# Patient Record
Sex: Female | Born: 1995 | Race: Black or African American | Hispanic: No | Marital: Single | Smoking: Current every day smoker
Health system: Southern US, Community
[De-identification: ages and names within clinical notes are randomized; demographics above are authoritative.]

## PROBLEM LIST (undated history)

## (undated) DIAGNOSIS — F32A Depression, unspecified: Secondary | ICD-10-CM

## (undated) DIAGNOSIS — F329 Major depressive disorder, single episode, unspecified: Secondary | ICD-10-CM

## (undated) DIAGNOSIS — F39 Unspecified mood [affective] disorder: Secondary | ICD-10-CM

## (undated) DIAGNOSIS — Z9109 Other allergy status, other than to drugs and biological substances: Secondary | ICD-10-CM

## (undated) DIAGNOSIS — F419 Anxiety disorder, unspecified: Secondary | ICD-10-CM

## (undated) DIAGNOSIS — K219 Gastro-esophageal reflux disease without esophagitis: Secondary | ICD-10-CM

---
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
## 2014-04-14 ENCOUNTER — Emergency Department (HOSPITAL_COMMUNITY)
Admission: EM | Admit: 2014-04-14 | Discharge: 2014-04-14 | Disposition: A | Discharge status: Home / Self Care | Payer: Self-pay | Attending: Emergency Medicine | Admitting: Emergency Medicine

## 2014-04-14 ENCOUNTER — Emergency Department (HOSPITAL_COMMUNITY): Payer: No Typology Code available for payment source

## 2014-04-14 ENCOUNTER — Encounter (HOSPITAL_COMMUNITY): Payer: Self-pay | Admitting: *Deleted

## 2014-04-14 DIAGNOSIS — Z3202 Encounter for pregnancy test, result negative: Secondary | ICD-10-CM | POA: Insufficient documentation

## 2014-04-14 DIAGNOSIS — Z79899 Other long term (current) drug therapy: Secondary | ICD-10-CM | POA: Insufficient documentation

## 2014-04-14 DIAGNOSIS — Y998 Other external cause status: Secondary | ICD-10-CM | POA: Insufficient documentation

## 2014-04-14 DIAGNOSIS — S60222A Contusion of left hand, initial encounter: Secondary | ICD-10-CM | POA: Insufficient documentation

## 2014-04-14 DIAGNOSIS — J45909 Unspecified asthma, uncomplicated: Secondary | ICD-10-CM | POA: Insufficient documentation

## 2014-04-14 DIAGNOSIS — W1839XA Other fall on same level, initial encounter: Secondary | ICD-10-CM | POA: Insufficient documentation

## 2014-04-14 DIAGNOSIS — Z720 Tobacco use: Secondary | ICD-10-CM | POA: Insufficient documentation

## 2014-04-14 DIAGNOSIS — S8992XA Unspecified injury of left lower leg, initial encounter: Secondary | ICD-10-CM | POA: Insufficient documentation

## 2014-04-14 DIAGNOSIS — Y9389 Activity, other specified: Secondary | ICD-10-CM | POA: Insufficient documentation

## 2014-04-14 DIAGNOSIS — Z8719 Personal history of other diseases of the digestive system: Secondary | ICD-10-CM | POA: Insufficient documentation

## 2014-04-14 DIAGNOSIS — Y929 Unspecified place or not applicable: Secondary | ICD-10-CM | POA: Insufficient documentation

## 2014-04-14 DIAGNOSIS — M25559 Pain in unspecified hip: Secondary | ICD-10-CM

## 2014-04-14 DIAGNOSIS — S7002XA Contusion of left hip, initial encounter: Secondary | ICD-10-CM | POA: Insufficient documentation

## 2014-04-14 LAB — POC URINE PREG, ED: Preg Test, Ur: NEGATIVE

## 2014-04-14 MED ORDER — LORAZEPAM 1 MG PO TABS
0.5000 mg | ORAL_TABLET | Freq: Once | ORAL | Status: AC
  Administered 2014-04-14: 0.5 mg via ORAL
  Filled 2014-04-14: qty 1

## 2014-04-14 MED ORDER — HYDROCODONE-ACETAMINOPHEN 5-325 MG PO TABS: 1.0000 | ORAL_TABLET | Freq: Four times a day (QID) | ORAL | Status: DC | PRN

## 2014-04-14 MED ORDER — HYDROCODONE-ACETAMINOPHEN 5-325 MG PO TABS
1.0000 | ORAL_TABLET | Freq: Once | ORAL | Status: AC
  Administered 2014-04-14: 1 via ORAL
  Filled 2014-04-14: qty 1
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
## 2014-04-14 NOTE — ED Notes (Signed)
MD at bedside. 

## 2014-04-14 NOTE — Discharge Instructions (Signed)
Contusion °A contusion is a deep bruise. Contusions are the result of an injury that caused bleeding under the skin. The contusion may turn blue, purple, or yellow. Minor injuries will give you a painless contusion, but more severe contusions may stay painful and swollen for a few weeks.  °CAUSES  °A contusion is usually caused by a blow, trauma, or direct force to an area of the body. °SYMPTOMS  °· Swelling and redness of the injured area. °· Bruising of the injured area. °· Tenderness and soreness of the injured area. °· Pain. °DIAGNOSIS  °The diagnosis can be made by taking a history and physical exam. An X-ray, CT scan, or MRI may be needed to determine if there were any associated injuries, such as fractures. °TREATMENT  °Specific treatment will depend on what area of the body was injured. In general, the best treatment for a contusion is resting, icing, elevating, and applying cold compresses to the injured area. Over-the-counter medicines may also be recommended for pain control. Ask your caregiver what the best treatment is for your contusion. °HOME CARE INSTRUCTIONS  °· Put ice on the injured area. °¨ Put ice in a plastic bag. °¨ Place a towel between your skin and the bag. °¨ Leave the ice on for 15-20 minutes, 3-4 times a day, or as directed by your health care provider. °· Only take over-the-counter or prescription medicines for pain, discomfort, or fever as directed by your caregiver. Your caregiver may recommend avoiding anti-inflammatory medicines (aspirin, ibuprofen, and naproxen) for 48 hours because these medicines may increase bruising. °· Rest the injured area. °· If possible, elevate the injured area to reduce swelling. °SEEK IMMEDIATE MEDICAL CARE IF:  °· You have increased bruising or swelling. °· You have pain that is getting worse. °· Your swelling or pain is not relieved with medicines. °MAKE SURE YOU:  °· Understand these instructions. °· Will watch your condition. °· Will get help right  away if you are not doing well or get worse. °Document Released: 10/13/2004 Document Revised: 01/08/2013 Document Reviewed: 11/08/2010 °[employer]® Patient Information ©2015 [employer]. This information is not intended to replace advice given to you by your health care provider. Make sure you discuss any questions you have with your health care provider. ° °

## 2014-04-14 NOTE — ED Notes (Signed)
Patient transported to X-ray 
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
## 2014-08-30 ENCOUNTER — Encounter (HOSPITAL_BASED_OUTPATIENT_CLINIC_OR_DEPARTMENT_OTHER): Payer: Self-pay | Admitting: *Deleted

## 2014-08-30 ENCOUNTER — Telehealth: Payer: Self-pay | Admitting: *Deleted

## 2014-08-30 ENCOUNTER — Emergency Department (HOSPITAL_BASED_OUTPATIENT_CLINIC_OR_DEPARTMENT_OTHER)
Admission: EM | Admit: 2014-08-30 | Discharge: 2014-08-30 | Disposition: A | Discharge status: Home / Self Care | Payer: Self-pay | Attending: Physician Assistant | Admitting: Physician Assistant

## 2014-08-30 ENCOUNTER — Emergency Department (HOSPITAL_BASED_OUTPATIENT_CLINIC_OR_DEPARTMENT_OTHER): Payer: Self-pay

## 2014-08-30 DIAGNOSIS — Z3202 Encounter for pregnancy test, result negative: Secondary | ICD-10-CM | POA: Insufficient documentation

## 2014-08-30 DIAGNOSIS — Z87891 Personal history of nicotine dependence: Secondary | ICD-10-CM | POA: Insufficient documentation

## 2014-08-30 DIAGNOSIS — J45909 Unspecified asthma, uncomplicated: Secondary | ICD-10-CM | POA: Insufficient documentation

## 2014-08-30 DIAGNOSIS — Z8739 Personal history of other diseases of the musculoskeletal system and connective tissue: Secondary | ICD-10-CM | POA: Insufficient documentation

## 2014-08-30 DIAGNOSIS — Z79899 Other long term (current) drug therapy: Secondary | ICD-10-CM | POA: Insufficient documentation

## 2014-08-30 DIAGNOSIS — J189 Pneumonia, unspecified organism: Secondary | ICD-10-CM

## 2014-08-30 DIAGNOSIS — F329 Major depressive disorder, single episode, unspecified: Secondary | ICD-10-CM | POA: Insufficient documentation

## 2014-08-30 DIAGNOSIS — J159 Unspecified bacterial pneumonia: Secondary | ICD-10-CM | POA: Insufficient documentation

## 2014-08-30 HISTORY — DX: Major depressive disorder, single episode, unspecified: F32.9

## 2014-08-30 HISTORY — DX: Unspecified mood (affective) disorder: F39

## 2014-08-30 HISTORY — DX: Depression, unspecified: F32.A

## 2014-08-30 HISTORY — DX: Anxiety disorder, unspecified: F41.9

## 2014-08-30 LAB — URINE MICROSCOPIC-ADD ON

## 2014-08-30 LAB — PREGNANCY, URINE: PREG TEST UR: NEGATIVE

## 2014-08-30 LAB — URINALYSIS, ROUTINE W REFLEX MICROSCOPIC
Bilirubin Urine: NEGATIVE
Glucose, UA: NEGATIVE mg/dL
Hgb urine dipstick: NEGATIVE
Ketones, ur: NEGATIVE mg/dL
Nitrite: NEGATIVE
PH: 7 (ref 5.0–8.0)
Protein, ur: NEGATIVE mg/dL
SPECIFIC GRAVITY, URINE: 1.017 (ref 1.005–1.030)
UROBILINOGEN UA: 1 mg/dL (ref 0.0–1.0)

## 2014-08-30 LAB — CBC WITH DIFFERENTIAL/PLATELET
BASOS ABS: 0 10*3/uL (ref 0.0–0.1)
BASOS PCT: 0 % (ref 0–1)
Band Neutrophils: 2 % (ref 0–10)
EOS ABS: 0.2 10*3/uL (ref 0.0–0.7)
Eosinophils Relative: 1 % (ref 0–5)
HEMATOCRIT: 35.2 % — AB (ref 36.0–46.0)
Hemoglobin: 12.7 g/dL (ref 12.0–15.0)
Lymphocytes Relative: 14 % (ref 12–46)
Lymphs Abs: 3.2 10*3/uL (ref 0.7–4.0)
MCH: 28.5 pg (ref 26.0–34.0)
MCHC: 36.1 g/dL — ABNORMAL HIGH (ref 30.0–36.0)
MCV: 79.1 fL (ref 78.0–100.0)
MONOS PCT: 6 % (ref 3–12)
Monocytes Absolute: 1.4 10*3/uL — ABNORMAL HIGH (ref 0.1–1.0)
NEUTROS PCT: 77 % (ref 43–77)
Neutro Abs: 18.2 10*3/uL — ABNORMAL HIGH (ref 1.7–7.7)
Platelets: 309 10*3/uL (ref 150–400)
RBC: 4.45 MIL/uL (ref 3.87–5.11)
RDW: 14.6 % (ref 11.5–15.5)
WBC: 23 10*3/uL — ABNORMAL HIGH (ref 4.0–10.5)

## 2014-08-30 LAB — BASIC METABOLIC PANEL
ANION GAP: 9 (ref 5–15)
BUN: 6 mg/dL (ref 6–20)
CHLORIDE: 106 mmol/L (ref 101–111)
CO2: 23 mmol/L (ref 22–32)
Calcium: 8.8 mg/dL — ABNORMAL LOW (ref 8.9–10.3)
Creatinine, Ser: 0.81 mg/dL (ref 0.44–1.00)
GFR calc Af Amer: >60 mL/min (ref >60)
GFR calc non Af Amer: >60 mL/min (ref >60)
Glucose, Bld: 102 mg/dL — ABNORMAL HIGH (ref 65–99)
POTASSIUM: 3.4 mmol/L — AB (ref 3.5–5.1)
Sodium: 138 mmol/L (ref 135–145)

## 2014-08-30 MED ORDER — IPRATROPIUM-ALBUTEROL 0.5-2.5 (3) MG/3ML IN SOLN
RESPIRATORY_TRACT | Status: AC
  Administered 2014-08-30: 3 mL
  Filled 2014-08-30: qty 3

## 2014-08-30 MED ORDER — ACETAMINOPHEN 325 MG PO TABS
650.0000 mg | ORAL_TABLET | Freq: Once | ORAL | Status: AC
  Administered 2014-08-30: 650 mg via ORAL
  Filled 2014-08-30: qty 2

## 2014-08-30 MED ORDER — AZITHROMYCIN 250 MG PO TABS: 250.0000 mg | ORAL_TABLET | Freq: Once | ORAL | Status: DC

## 2014-08-30 MED ORDER — ONDANSETRON HCL 4 MG PO TABS: 4.0000 mg | ORAL_TABLET | Freq: Three times a day (TID) | ORAL | Status: DC | PRN

## 2014-08-30 MED ORDER — ACETAMINOPHEN 325 MG PO TABS: 650.0000 mg | ORAL_TABLET | Freq: Four times a day (QID) | ORAL | Status: DC | PRN

## 2014-08-30 MED ORDER — ONDANSETRON 4 MG PO TBDP
4.0000 mg | ORAL_TABLET | Freq: Once | ORAL | Status: AC
  Administered 2014-08-30: 4 mg via ORAL
  Filled 2014-08-30: qty 1

## 2014-08-30 MED ORDER — AZITHROMYCIN 250 MG PO TABS
500.0000 mg | ORAL_TABLET | Freq: Once | ORAL | Status: AC
  Administered 2014-08-30: 500 mg via ORAL
  Filled 2014-08-30: qty 2

## 2014-08-30 MED ORDER — LEVOFLOXACIN 750 MG PO TABS: 750.0000 mg | ORAL_TABLET | Freq: Once | ORAL | Status: DC

## 2014-08-30 MED ORDER — SODIUM CHLORIDE 0.9 % IV BOLUS (SEPSIS)
1000.0000 mL | Freq: Once | INTRAVENOUS | Status: AC
  Administered 2014-08-30: 1000 mL via INTRAVENOUS

## 2014-08-30 MED ORDER — ALBUTEROL SULFATE (2.5 MG/3ML) 0.083% IN NEBU
INHALATION_SOLUTION | RESPIRATORY_TRACT | Status: AC
  Administered 2014-08-30: 2.5 mg
  Filled 2014-08-30: qty 3

## 2014-08-30 NOTE — ED Notes (Addendum)
Pt reports productive cough x 1 week-  States hx of allergies- has been taking mucinex, robitussin, mucous relief, benadryl, theraflu without relief

## 2014-08-30 NOTE — ED Provider Notes (Addendum)
CSN: [id number]     Arrival date & time 08/30/14  1044 History   First MD Initiated Contact with Patient 08/30/14 1129     Chief Complaint  Patient presents with  . Cough     (Consider location/radiation/quality/duration/timing/severity/associated sxs/prior Treatment) HPI   Patient is a 19 year old female presenting today with pain during deep breaths. She feels that she's had a cough. She's had occasional fevers at home. She feels unwell. Patient's been taking NyQuil and Benadryl at home. Patient is tearful on exam about how much pain she is in. She states she has pain all over. From her head down to her pelvis.   Past Medical History  Diagnosis Date  . GERD (gastroesophageal reflux disease)   . Environmental allergies   . Asthma   . Mood disorder   . Depression   . Anxiety    History reviewed. No pertinent past surgical history. Family History  Problem Relation Age of Onset  . Hypertension Mother   . Diabetes Other   . Hypertension Other    Social History  Substance Use Topics  . Smoking status: Former Smoker -- 0.00 packs/day    Types: Cigarettes    Quit date: 03/30/2014  . Smokeless tobacco: Never Used  . Alcohol Use: No   OB History    No data available     Review of Systems  Constitutional: Negative for activity change and fatigue.  HENT: Positive for congestion and sore throat. Negative for drooling.   Eyes: Negative for discharge.  Respiratory: Positive for cough and chest tightness.   Cardiovascular: Positive for chest pain.  Gastrointestinal: Negative for abdominal distention.  Genitourinary: Negative for dysuria and difficulty urinating.  Musculoskeletal: Negative for joint swelling.  Skin: Negative for rash.  Allergic/Immunologic: Negative for immunocompromised state.  Neurological: Negative for seizures and speech difficulty.  Psychiatric/Behavioral: Negative for behavioral problems and agitation.      Allergies  Review of patient's allergies  indicates no known allergies.  Home Medications   Prior to Admission medications   Medication Sig Start Date End Date Taking? Authorizing Provider  EPINEPHrine (EPIPEN 2-PAK IJ) Inject 1 Syringe as directed once as needed. For allergic reaction   Yes Historical Provider, MD  QUEtiapine (SEROQUEL) 100 MG tablet Take 100 mg by mouth at bedtime.   Yes Historical Provider, MD  albuterol (PROVENTIL HFA;VENTOLIN HFA) 108 (90 BASE) MCG/ACT inhaler Inhale 2 puffs into the lungs every 4 (four) hours as needed. For shortness of breath or wheezing    Historical Provider, MD  chlorpheniramine-HYDROcodone (TUSSIONEX PENNKINETIC ER) 10-8 MG/5ML LQCR Take 5 mLs by mouth every 12 (twelve) hours as needed for cough. Patient not taking: Reported on 04/14/2014 03/20/13   [name], MD  HYDROcodone-acetaminophen (NORCO/VICODIN) 5-325 MG per tablet Take 1 tablet by mouth every 6 (six) hours as needed. 04/14/14   Tilden Fossa, MD   BP 119/79 mmHg  Pulse 106  Temp(Src) 99.7 F (37.6 C) (Oral)  Resp 20  SpO2 100%  LMP 08/15/2014 Physical Exam  Constitutional: She is oriented to person, place, and time. She appears well-developed and well-nourished.  HENT:  Head: Normocephalic and atraumatic.  Right Ear: External ear normal.  Left Ear: External ear normal.  Nose: Nose normal.  Mouth/Throat: Oropharynx is clear and moist. No oropharyngeal exudate.  Eyes: Conjunctivae are normal. Right eye exhibits no discharge.  Neck: Neck supple.  Cardiovascular: Normal rate, regular rhythm and normal heart sounds.   No murmur heard. Pulmonary/Chest: Effort normal and breath sounds normal.  She has no wheezes. She has no rales.  Abdominal: Soft. She exhibits no distension. There is no tenderness.  Musculoskeletal: Normal range of motion. She exhibits no edema.  Neurological: She is oriented to person, place, and time. No cranial nerve deficit.  Skin: Skin is warm and dry. No rash noted. She is not diaphoretic.   Psychiatric: She has a normal mood and affect. Her behavior is normal.  Nursing note and vitals reviewed.   ED Course  Procedures (including critical care time) Labs Review Labs Reviewed  URINALYSIS, ROUTINE W REFLEX MICROSCOPIC (NOT AT [facility]) - Abnormal; Notable for the following:    APPearance CLOUDY (*)    Leukocytes, UA SMALL (*)    All other components within normal limits  URINE MICROSCOPIC-ADD ON - Abnormal; Notable for the following:    Bacteria, UA FEW (*)    All other components within normal limits  PREGNANCY, URINE  CBC WITH DIFFERENTIAL/PLATELET  BASIC METABOLIC PANEL    Imaging Review Dg Chest 2 View  08/30/2014   CLINICAL DATA:  Cough  EXAM: CHEST  2 VIEW  COMPARISON:  03/20/2014  FINDINGS: There is patchy infiltrate/pneumonia in left lower lobe posteriorly best seen on lateral view. No pulmonary edema. Bony thorax is unremarkable. Cardiomediastinal silhouette is stable.  IMPRESSION: Patchy infiltrate/pneumonia in left lower lobe retrocardiac.   Electronically Signed   By: Natasha Mead M.D.   On: 08/30/2014 11:44   I, Yoshua Geisinger L Braian Tijerina, personally reviewed and evaluated these images and lab results as part of my medical decision-making.   EKG Interpretation None      MDM   Final diagnoses:  None    She is a 19 year old female presenting with cough and pain with cough. Differential includes bronchitis versus asthma versus pneumonia. Chest x-ray shows pneumonia. We'll treat with azithromycin. We'll give Tylenol to help with pain. We'll give fluids to make patient feel symptomatically better. We'll make sure the patient is able to take PO and take her antibiotics at home before discharge.  2:48 PM Patient taking PO and feeling improved. Abelino Derrick, MD 08/30/14 1222  Cleven Jansma Randall An, MD 08/30/14 [id number]

## 2014-08-31 NOTE — Telephone Encounter (Signed)
Clarified dose pak order for Pharmacist.

## 2014-11-08 ENCOUNTER — Emergency Department (HOSPITAL_COMMUNITY)
Admission: EM | Admit: 2014-11-08 | Discharge: 2014-11-08 | Disposition: A | Discharge status: Home / Self Care | Payer: No Typology Code available for payment source | Attending: Emergency Medicine | Admitting: Emergency Medicine

## 2014-11-08 ENCOUNTER — Encounter (HOSPITAL_COMMUNITY): Payer: Self-pay | Admitting: Nurse Practitioner

## 2014-11-08 DIAGNOSIS — Z79899 Other long term (current) drug therapy: Secondary | ICD-10-CM | POA: Diagnosis not present

## 2014-11-08 DIAGNOSIS — Y92410 Unspecified street and highway as the place of occurrence of the external cause: Secondary | ICD-10-CM | POA: Insufficient documentation

## 2014-11-08 DIAGNOSIS — Y9389 Activity, other specified: Secondary | ICD-10-CM | POA: Insufficient documentation

## 2014-11-08 DIAGNOSIS — F329 Major depressive disorder, single episode, unspecified: Secondary | ICD-10-CM | POA: Diagnosis not present

## 2014-11-08 DIAGNOSIS — S79911A Unspecified injury of right hip, initial encounter: Secondary | ICD-10-CM | POA: Insufficient documentation

## 2014-11-08 DIAGNOSIS — F419 Anxiety disorder, unspecified: Secondary | ICD-10-CM | POA: Diagnosis not present

## 2014-11-08 DIAGNOSIS — J4521 Mild intermittent asthma with (acute) exacerbation: Secondary | ICD-10-CM | POA: Insufficient documentation

## 2014-11-08 DIAGNOSIS — Z8719 Personal history of other diseases of the digestive system: Secondary | ICD-10-CM | POA: Diagnosis not present

## 2014-11-08 DIAGNOSIS — S79921A Unspecified injury of right thigh, initial encounter: Secondary | ICD-10-CM | POA: Diagnosis not present

## 2014-11-08 DIAGNOSIS — Y998 Other external cause status: Secondary | ICD-10-CM | POA: Diagnosis not present

## 2014-11-08 DIAGNOSIS — Z87891 Personal history of nicotine dependence: Secondary | ICD-10-CM | POA: Insufficient documentation

## 2014-11-08 DIAGNOSIS — S3992XA Unspecified injury of lower back, initial encounter: Secondary | ICD-10-CM | POA: Insufficient documentation

## 2014-11-08 MED ORDER — METHOCARBAMOL 500 MG PO TABS: 500.0000 mg | ORAL_TABLET | Freq: Two times a day (BID) | ORAL | Status: DC

## 2014-11-08 MED ORDER — IBUPROFEN 400 MG PO TABS: 400.0000 mg | ORAL_TABLET | Freq: Four times a day (QID) | ORAL | Status: DC | PRN

## 2014-11-08 MED ORDER — ALBUTEROL SULFATE (2.5 MG/3ML) 0.083% IN NEBU
5.0000 mg | INHALATION_SOLUTION | Freq: Once | RESPIRATORY_TRACT | Status: AC
  Administered 2014-11-08: 5 mg via RESPIRATORY_TRACT
  Filled 2014-11-08: qty 6

## 2014-11-08 MED ORDER — PREDNISONE 20 MG PO TABS: ORAL_TABLET | ORAL | Status: DC

## 2014-11-08 MED ORDER — IPRATROPIUM BROMIDE 0.02 % IN SOLN
0.5000 mg | Freq: Once | RESPIRATORY_TRACT | Status: AC
  Administered 2014-11-08: 0.5 mg via RESPIRATORY_TRACT
  Filled 2014-11-08: qty 2.5

## 2014-11-08 MED ORDER — IBUPROFEN 400 MG PO TABS
800.0000 mg | ORAL_TABLET | Freq: Once | ORAL | Status: AC
  Administered 2014-11-08: 800 mg via ORAL
  Filled 2014-11-08: qty 2
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
## 2014-11-08 NOTE — ED Notes (Signed)
Pt able to dress independently and walk with crutches

## 2015-04-14 ENCOUNTER — Emergency Department (HOSPITAL_COMMUNITY)
Admission: EM | Admit: 2015-04-14 | Discharge: 2015-04-14 | Disposition: A | Discharge status: Home / Self Care | Payer: No Typology Code available for payment source | Attending: Emergency Medicine | Admitting: Emergency Medicine

## 2015-04-14 ENCOUNTER — Encounter (HOSPITAL_COMMUNITY): Payer: Self-pay | Admitting: Emergency Medicine

## 2015-04-14 DIAGNOSIS — Z87891 Personal history of nicotine dependence: Secondary | ICD-10-CM | POA: Insufficient documentation

## 2015-04-14 DIAGNOSIS — R112 Nausea with vomiting, unspecified: Secondary | ICD-10-CM | POA: Insufficient documentation

## 2015-04-14 DIAGNOSIS — F329 Major depressive disorder, single episode, unspecified: Secondary | ICD-10-CM | POA: Insufficient documentation

## 2015-04-14 DIAGNOSIS — R509 Fever, unspecified: Secondary | ICD-10-CM | POA: Insufficient documentation

## 2015-04-14 DIAGNOSIS — R197 Diarrhea, unspecified: Secondary | ICD-10-CM | POA: Insufficient documentation

## 2015-04-14 DIAGNOSIS — J45901 Unspecified asthma with (acute) exacerbation: Secondary | ICD-10-CM | POA: Insufficient documentation

## 2015-04-14 DIAGNOSIS — Z8719 Personal history of other diseases of the digestive system: Secondary | ICD-10-CM | POA: Insufficient documentation

## 2015-04-14 DIAGNOSIS — N644 Mastodynia: Secondary | ICD-10-CM

## 2015-04-14 DIAGNOSIS — Z79899 Other long term (current) drug therapy: Secondary | ICD-10-CM | POA: Insufficient documentation

## 2015-04-14 DIAGNOSIS — Z3202 Encounter for pregnancy test, result negative: Secondary | ICD-10-CM | POA: Insufficient documentation

## 2015-04-14 LAB — COMPREHENSIVE METABOLIC PANEL
ALBUMIN: 3.6 g/dL (ref 3.5–5.0)
ALT: 13 U/L — AB (ref 14–54)
AST: 19 U/L (ref 15–41)
Alkaline Phosphatase: 68 U/L (ref 38–126)
Anion gap: 10 (ref 5–15)
BUN: 7 mg/dL (ref 6–20)
CALCIUM: 9.1 mg/dL (ref 8.9–10.3)
CHLORIDE: 107 mmol/L (ref 101–111)
CO2: 23 mmol/L (ref 22–32)
Creatinine, Ser: 0.88 mg/dL (ref 0.44–1.00)
GFR calc Af Amer: >60 mL/min (ref >60)
GLUCOSE: 98 mg/dL (ref 65–99)
POTASSIUM: 3.6 mmol/L (ref 3.5–5.1)
Sodium: 140 mmol/L (ref 135–145)
Total Bilirubin: 0.7 mg/dL (ref 0.3–1.2)
Total Protein: 6.9 g/dL (ref 6.5–8.1)

## 2015-04-14 LAB — CBC
HEMATOCRIT: 36.7 % (ref 36.0–46.0)
HEMOGLOBIN: 12.3 g/dL (ref 12.0–15.0)
MCH: 26.9 pg (ref 26.0–34.0)
MCHC: 33.5 g/dL (ref 30.0–36.0)
MCV: 80.1 fL (ref 78.0–100.0)
Platelets: 351 10*3/uL (ref 150–400)
RBC: 4.58 MIL/uL (ref 3.87–5.11)
RDW: 15.6 % — ABNORMAL HIGH (ref 11.5–15.5)
WBC: 10.9 10*3/uL — ABNORMAL HIGH (ref 4.0–10.5)

## 2015-04-14 LAB — URINALYSIS, ROUTINE W REFLEX MICROSCOPIC
Glucose, UA: NEGATIVE mg/dL
HGB URINE DIPSTICK: NEGATIVE
Ketones, ur: 40 mg/dL — AB
NITRITE: NEGATIVE
PH: 7.5 (ref 5.0–8.0)
Protein, ur: NEGATIVE mg/dL
Specific Gravity, Urine: 1.025 (ref 1.005–1.030)

## 2015-04-14 LAB — URINE MICROSCOPIC-ADD ON: RBC / HPF: NONE SEEN RBC/hpf (ref 0–5)

## 2015-04-14 LAB — LIPASE, BLOOD: LIPASE: 28 U/L (ref 11–51)

## 2015-04-14 LAB — POC URINE PREG, ED: PREG TEST UR: NEGATIVE

## 2015-04-14 MED ORDER — PROMETHAZINE HCL 25 MG PO TABS: 25.0000 mg | ORAL_TABLET | Freq: Four times a day (QID) | ORAL | Status: DC | PRN

## 2015-04-14 MED ORDER — ALBUTEROL SULFATE (2.5 MG/3ML) 0.083% IN NEBU
2.5000 mg | INHALATION_SOLUTION | Freq: Once | RESPIRATORY_TRACT | Status: AC
  Administered 2015-04-14: 2.5 mg via RESPIRATORY_TRACT
  Filled 2015-04-14: qty 3

## 2015-04-14 MED ORDER — DIPHENOXYLATE-ATROPINE 2.5-0.025 MG PO TABS: 1.0000 | ORAL_TABLET | Freq: Four times a day (QID) | ORAL | Status: DC | PRN

## 2015-04-14 MED ORDER — SODIUM CHLORIDE 0.9 % IV BOLUS (SEPSIS)
1000.0000 mL | Freq: Once | INTRAVENOUS | Status: AC
  Administered 2015-04-14: 1000 mL via INTRAVENOUS

## 2015-04-14 MED ORDER — ONDANSETRON HCL 4 MG/2ML IJ SOLN
4.0000 mg | Freq: Once | INTRAMUSCULAR | Status: AC
  Administered 2015-04-14: 4 mg via INTRAVENOUS
  Filled 2015-04-14: qty 2

## 2015-04-14 NOTE — ED Notes (Signed)
Pt. reports emesis and diarrhea onset today , denies fever or chills .  

## 2015-04-14 NOTE — Discharge Instructions (Signed)
Diarrhea °Diarrhea is frequent loose and watery bowel movements. It can cause you to feel weak and dehydrated. Dehydration can cause you to become tired and thirsty, have a dry mouth, and have decreased urination that often is dark yellow. Diarrhea is a sign of another problem, most often an infection that will not last long. In most cases, diarrhea typically lasts 2-3 days. However, it can last longer if it is a sign of something more serious. It is important to treat your diarrhea as directed by your caregiver to lessen or prevent future episodes of diarrhea. °CAUSES  °Some common causes include: °· Gastrointestinal infections caused by viruses, bacteria, or parasites. °· Food poisoning or food allergies. °· Certain medicines, such as antibiotics, chemotherapy, and laxatives. °· Artificial sweeteners and fructose. °· Digestive disorders. °HOME CARE INSTRUCTIONS °· Ensure adequate fluid intake (hydration): Have 1 cup (8 oz) of fluid for each diarrhea episode. Avoid fluids that contain simple sugars or sports drinks, fruit juices, whole milk products, and sodas. Your urine should be clear or pale yellow if you are drinking enough fluids. Hydrate with an oral rehydration solution that you can purchase at pharmacies, retail stores, and online. You can prepare an oral rehydration solution at home by mixing the following ingredients together: °·  - tsp table salt. °· ¾ tsp baking soda. °·  tsp salt substitute containing potassium chloride. °· 1  tablespoons sugar. °· 1 L (34 oz) of water. °· Certain foods and beverages may increase the speed at which food moves through the gastrointestinal (GI) tract. These foods and beverages should be avoided and include: °· Caffeinated and alcoholic beverages. °· High-fiber foods, such as raw fruits and vegetables, nuts, seeds, and whole grain breads and cereals. °· Foods and beverages sweetened with sugar alcohols, such as xylitol, sorbitol, and mannitol. °· Some foods may be well  tolerated and may help thicken stool including: °· Starchy foods, such as rice, toast, pasta, low-sugar cereal, oatmeal, grits, baked potatoes, crackers, and bagels. °· Bananas. °· Applesauce. °· Add probiotic-rich foods to help increase healthy bacteria in the GI tract, such as yogurt and fermented milk products. °· Wash your hands well after each diarrhea episode. °· Only take over-the-counter or prescription medicines as directed by your caregiver. °· Take a warm bath to relieve any burning or pain from frequent diarrhea episodes. °SEEK IMMEDIATE MEDICAL CARE IF:  °· You are unable to keep fluids down. °· You have persistent vomiting. °· You have blood in your stool, or your stools are black and tarry. °· You do not urinate in 6-8 hours, or there is only a small amount of very dark urine. °· You have abdominal pain that increases or localizes. °· You have weakness, dizziness, confusion, or light-headedness. °· You have a severe headache. °· Your diarrhea gets worse or does not get better. °· You have a fever or persistent symptoms for more than 2-3 days. °· You have a fever and your symptoms suddenly get worse. °MAKE SURE YOU:  °· Understand these instructions. °· Will watch your condition. °· Will get help right away if you are not doing well or get worse. °  °This information is not intended to replace advice given to you by your health care provider. Make sure you discuss any questions you have with your health care provider. °  °Document Released: 12/24/2001 Document Revised: 01/24/2014 Document Reviewed: 09/11/2011 °Elsevier Interactive Patient Education ©2016 Elsevier Inc. ° °Nausea and Vomiting °Nausea is a sick feeling that often comes   before throwing up (vomiting). Vomiting is a reflex where stomach contents come out of your mouth. Vomiting can cause severe loss of body fluids (dehydration). Children and elderly adults can become dehydrated quickly, especially if they also have diarrhea. Nausea and  vomiting are symptoms of a condition or disease. It is important to find the cause of your symptoms. CAUSES   Direct irritation of the stomach lining. This irritation can result from increased acid production (gastroesophageal reflux disease), infection, food poisoning, taking certain medicines (such as nonsteroidal anti-inflammatory drugs), alcohol use, or tobacco use.  Signals from the brain.These signals could be caused by a headache, heat exposure, an inner ear disturbance, increased pressure in the brain from injury, infection, a tumor, or a concussion, pain, emotional stimulus, or metabolic problems.  An obstruction in the gastrointestinal tract (bowel obstruction).  Illnesses such as diabetes, hepatitis, gallbladder problems, appendicitis, kidney problems, cancer, sepsis, atypical symptoms of a heart attack, or eating disorders.  Medical treatments such as chemotherapy and radiation.  Receiving medicine that makes you sleep (general anesthetic) during surgery. DIAGNOSIS Your caregiver may ask for tests to be done if the problems do not improve after a few days. Tests may also be done if symptoms are severe or if the reason for the nausea and vomiting is not clear. Tests may include:  Urine tests.  Blood tests.  Stool tests.  Cultures (to look for evidence of infection).  X-rays or other imaging studies. Test results can help your caregiver make decisions about treatment or the need for additional tests. TREATMENT You need to stay well hydrated. Drink frequently but in small amounts.You may wish to drink water, sports drinks, clear broth, or eat frozen ice pops or gelatin dessert to help stay hydrated.When you eat, eating slowly may help prevent nausea.There are also some antinausea medicines that may help prevent nausea. HOME CARE INSTRUCTIONS   Take all medicine as directed by your caregiver.  If you do not have an appetite, do not force yourself to eat. However, you must  continue to drink fluids.  If you have an appetite, eat a normal diet unless your caregiver tells you differently.  Eat a variety of complex carbohydrates (rice, wheat, potatoes, bread), lean meats, yogurt, fruits, and vegetables.  Avoid high-fat foods because they are more difficult to digest.  Drink enough water and fluids to keep your urine clear or pale yellow.  If you are dehydrated, ask your caregiver for specific rehydration instructions. Signs of dehydration may include:  Severe thirst.  Dry lips and mouth.  Dizziness.  Dark urine.  Decreasing urine frequency and amount.  Confusion.  Rapid breathing or pulse. SEEK IMMEDIATE MEDICAL CARE IF:   You have blood or brown flecks (like coffee grounds) in your vomit.  You have black or bloody stools.  You have a severe headache or stiff neck.  You are confused.  You have severe abdominal pain.  You have chest pain or trouble breathing.  You do not urinate at least once every 8 hours.  You develop cold or clammy skin.  You continue to vomit for longer than 24 to 48 hours.  You have a fever. MAKE SURE YOU:   Understand these instructions.  Will watch your condition.  Will get help right away if you are not doing well or get worse.   This information is not intended to replace advice given to you by your health care provider. Make sure you discuss any questions you have with your health care  provider.   Document Released: 01/03/2005 Document Revised: 03/28/2011 Document Reviewed: 06/02/2010 Elsevier Interactive Patient Education 2016 [employer]  Breast Tenderness Breast tenderness is a common problem for women of all ages. Breast tenderness may cause mild discomfort to severe pain. It has a variety of causes. Your health care provider will find out the likely cause of your breast tenderness by examining your breasts, asking you about symptoms, and ordering some tests. Breast tenderness usually does not  mean you have breast cancer. HOME CARE INSTRUCTIONS  Breast tenderness often can be handled at home. You can try:  Getting fitted for a new bra that provides more support, especially during exercise.  Wearing a more supportive bra or sports bra while sleeping when your breasts are very tender.  If you have a breast injury, apply ice to the area:  Put ice in a plastic bag.  Place a towel between your skin and the bag.  Leave the ice on for 20 minutes, 2-3 times a day.  If your breasts are too full of milk as a result of breastfeeding, try:  Expressing milk either by hand or with a breast pump.  Applying a warm compress to the breasts for relief.  Taking over-the-counter pain relievers, if approved by your health care provider.  Taking other medicines that your health care provider prescribes. These may include antibiotic medicines or birth control pills. Over the long term, your breast tenderness might be eased if you:  Cut down on caffeine.  Reduce the amount of fat in your diet. Keep a log of the days and times when your breasts are most tender. This will help you and your health care provider find the cause of the tenderness and how to relieve it. Also, learn how to do breast exams at home. This will help you notice if you have an unusual growth or lump that could cause tenderness. SEEK MEDICAL CARE IF:   Any part of your breast is hard, red, and hot to the touch. This could be a sign of infection.  Fluid is coming out of your nipples (and you are not breastfeeding). Especially watch for blood or pus.  You have a fever as well as breast tenderness.  You have a new or painful lump in your breast that remains after your menstrual period ends.  You have tried to take care of the pain at home, but it has not gone away.  Your breast pain is getting worse, or the pain is making it hard to do the things you usually do during your day.   This information is not intended to  replace advice given to you by your health care provider. Make sure you discuss any questions you have with your health care provider.   Document Released: 12/17/2007 Document Revised: 09/05/2012 Document Reviewed: 08/02/2012 Elsevier Interactive Patient Education [employer]2016 Elsevier Inc.

## 2015-04-14 NOTE — ED Provider Notes (Signed)
CSN: [id number]     Arrival date & time 04/14/15  0037 History   First MD Initiated Contact with Patient 04/14/15 0544     Chief Complaint  Patient presents with  . Emesis  . Diarrhea     (Consider location/radiation/quality/duration/timing/severity/associated sxs/prior Treatment) HPI Comments: Patient presents to the ER with multiple complaints. Patient reports that she became sick yesterday evening with acute onset of nausea, vomiting and diarrhea. She feels like she might have had a fever and has had chills and generalized body aches. She has not had any significant cough, but does feel like she has been having shortness of breath, needed breathing treatment. She does have a history of mild asthma. Additionally, patient is concerned because her right breast has been swollen and hurting and her grandmother died of breast cancer.  Patient is a 20 y.o. female presenting with vomiting and diarrhea.  Emesis Associated symptoms: chills and diarrhea   Diarrhea Associated symptoms: chills, fever and vomiting     Past Medical History  Diagnosis Date  . GERD (gastroesophageal reflux disease)   . Environmental allergies   . Asthma   . Mood disorder (HCC)   . Depression   . Anxiety    History reviewed. No pertinent past surgical history. Family History  Problem Relation Age of Onset  . Hypertension Mother   . Diabetes Other   . Hypertension Other    Social History  Substance Use Topics  . Smoking status: Former Smoker -- 0.00 packs/day    Types: Cigarettes    Quit date: 03/30/2014  . Smokeless tobacco: Never Used  . Alcohol Use: No   OB History    No data available     Review of Systems  Constitutional: Positive for fever and chills.  Respiratory: Positive for shortness of breath and wheezing.   Gastrointestinal: Positive for vomiting and diarrhea.  Skin: Negative for wound.  All other systems reviewed and are negative.     Allergies  Review of patient's allergies  indicates no known allergies.  Home Medications   Prior to Admission medications   Medication Sig Start Date End Date Taking? Authorizing Provider  acetaminophen (TYLENOL) 325 MG tablet Take 2 tablets (650 mg total) by mouth every 6 (six) hours as needed for fever or headache. 08/30/14   Courteney Lyn Mackuen, MD  albuterol (PROVENTIL HFA;VENTOLIN HFA) 108 (90 BASE) MCG/ACT inhaler Inhale 2 puffs into the lungs every 4 (four) hours as needed. For shortness of breath or wheezing    Historical Provider, MD  azithromycin (ZITHROMAX Z-PAK) 250 MG tablet Take 1 tablet (250 mg total) by mouth once. 08/30/14   Courteney Lyn Mackuen, MD  chlorpheniramine-HYDROcodone (TUSSIONEX PENNKINETIC ER) 10-8 MG/5ML LQCR Take 5 mLs by mouth every 12 (twelve) hours as needed for cough. Patient not taking: Reported on 04/14/2014 03/20/13   Zadie Rhineonald Wickline, MD  EPINEPHrine (EPIPEN 2-PAK IJ) Inject 1 Syringe as directed once as needed. For allergic reaction    Historical Provider, MD  HYDROcodone-acetaminophen (NORCO/VICODIN) 5-325 MG per tablet Take 1 tablet by mouth every 6 (six) hours as needed. 04/14/14   Tilden FossaElizabeth Rees, MD  ibuprofen (ADVIL,MOTRIN) 400 MG tablet Take 1 tablet (400 mg total) by mouth every 6 (six) hours as needed. 11/08/14   Fayrene HelperBowie Tran, PA-C  methocarbamol (ROBAXIN) 500 MG tablet Take 1 tablet (500 mg total) by mouth 2 (two) times daily. 11/08/14   Fayrene HelperBowie Tran, PA-C  ondansetron (ZOFRAN) 4 MG tablet Take 1 tablet (4 mg total) by mouth every  8 (eight) hours as needed for nausea or vomiting. 08/30/14   Courteney Lyn Mackuen, MD  predniSONE (DELTASONE) 20 MG tablet 2 tabs po daily x 4 days 11/08/14   Fayrene Helper, PA-C  QUEtiapine (SEROQUEL) 100 MG tablet Take 100 mg by mouth at bedtime.    Historical Provider, MD   BP 113/86 mmHg  Pulse 90  Temp(Src) 98.5 F (36.9 C) (Oral)  Resp 16  Ht  (1.626 m)  Wt 145 lb (65.772 kg)  BMI 24.88 kg/m2  SpO2 97%  LMP 04/07/2015 (Approximate) Physical Exam   Constitutional: She is oriented to person, place, and time. She appears well-developed and well-nourished. No distress.  HENT:  Head: Normocephalic and atraumatic.  Right Ear: Hearing normal.  Left Ear: Hearing normal.  Nose: Nose normal.  Mouth/Throat: Oropharynx is clear and moist and mucous membranes are normal.  Eyes: Conjunctivae and EOM are normal. Pupils are equal, round, and reactive to light.  Neck: Normal range of motion. Neck supple.  Cardiovascular: Regular rhythm, S1 normal and S2 normal.  Exam reveals no gallop and no friction rub.   No murmur heard. Pulmonary/Chest: Effort normal. No respiratory distress. She has decreased breath sounds. She has no wheezes. She exhibits no tenderness.  Abdominal: Soft. Normal appearance and bowel sounds are normal. There is no hepatosplenomegaly. There is no tenderness. There is no rebound, no guarding, no tenderness at McBurney's point and negative Murphy's sign. No hernia.  Genitourinary: There is breast tenderness (right). No breast swelling, discharge or bleeding.  Musculoskeletal: Normal range of motion.  Neurological: She is alert and oriented to person, place, and time. She has normal strength. No cranial nerve deficit or sensory deficit. Coordination normal. GCS eye subscore is 4. GCS verbal subscore is 5. GCS motor subscore is 6.  Skin: Skin is warm, dry and intact. No rash noted. No cyanosis.  Psychiatric: She has a normal mood and affect. Her speech is normal and behavior is normal. Thought content normal.  Nursing note and vitals reviewed.   ED Course  Procedures (including critical care time) Labs Review Labs Reviewed  COMPREHENSIVE METABOLIC PANEL - Abnormal; Notable for the following:    ALT 13 (*)    All other components within normal limits  CBC - Abnormal; Notable for the following:    WBC 10.9 (*)    RDW 15.6 (*)    All other components within normal limits  URINALYSIS, ROUTINE W REFLEX MICROSCOPIC (NOT AT [facility]) -  Abnormal; Notable for the following:    Color, Urine AMBER (*)    APPearance CLOUDY (*)    Bilirubin Urine SMALL (*)    Ketones, ur 40 (*)    Leukocytes, UA SMALL (*)    All other components within normal limits  URINE MICROSCOPIC-ADD ON - Abnormal; Notable for the following:    Squamous Epithelial / LPF 6-30 (*)    Bacteria, UA RARE (*)    All other components within normal limits  LIPASE, BLOOD  POC URINE PREG, ED    Imaging Review No results found. I have personally reviewed and evaluated these images and lab results as part of my medical decision-making.   EKG Interpretation None      MDM   Final diagnoses:  Nausea and vomiting, vomiting of unspecified type  Diarrhea of presumed infectious origin  Breast pain in female    Patient presents to the emergency department for evaluation of multiple problems. Patient has what appears to be viral gastroenteritis. She had onset  of symptoms yesterday with acute nausea, vomiting and diarrhea. She has a benign abdominal exam. Patient feels dehydrated because she has not been able to eat or drink for nearly 24 hours. She was given IV fluids here in the ER and is now tolerating by mouth intake.  Patient reports that she does have a history of mild asthma and feels like she had some wheezing earlier. There is no wheezing currently, however, her breath sounds were slightly diminished. She was given albuterol treatment here in the ER and will continue as needed as an outpatient.  She also complaining of right breast pain. She reports that it swells intermittently. She has not noticed any overlying skin changes and there has not been any drainage. Patient is very concerned because there is breast cancer in her family. I do not see any concerning features currently. There is no sign of abscess or infection. She will be referred to general surgery for [facility] evaluation.    Gilda Crease, MD 04/14/15 [phone]

## 2015-06-05 ENCOUNTER — Emergency Department (HOSPITAL_COMMUNITY)
Admission: EM | Admit: 2015-06-05 | Discharge: 2015-06-05 | Disposition: A | Discharge status: Home / Self Care | Payer: No Typology Code available for payment source | Attending: Emergency Medicine | Admitting: Emergency Medicine

## 2015-06-05 ENCOUNTER — Encounter (HOSPITAL_COMMUNITY): Payer: Self-pay

## 2015-06-05 DIAGNOSIS — J45909 Unspecified asthma, uncomplicated: Secondary | ICD-10-CM | POA: Insufficient documentation

## 2015-06-05 DIAGNOSIS — Z87891 Personal history of nicotine dependence: Secondary | ICD-10-CM | POA: Insufficient documentation

## 2015-06-05 DIAGNOSIS — F329 Major depressive disorder, single episode, unspecified: Secondary | ICD-10-CM | POA: Insufficient documentation

## 2015-06-05 DIAGNOSIS — J020 Streptococcal pharyngitis: Secondary | ICD-10-CM | POA: Insufficient documentation

## 2015-06-05 LAB — RAPID STREP SCREEN (MED CTR MEBANE ONLY): STREPTOCOCCUS, GROUP A SCREEN (DIRECT): POSITIVE — AB

## 2015-06-05 MED ORDER — PENICILLIN G BENZATHINE 1200000 UNIT/2ML IM SUSP
1.2000 10*6.[IU] | Freq: Once | INTRAMUSCULAR | Status: AC
  Administered 2015-06-05: 1.2 10*6.[IU] via INTRAMUSCULAR
  Filled 2015-06-05: qty 2

## 2015-06-05 NOTE — Discharge Instructions (Signed)
Pharyngitis Pharyngitis is a sore throat (pharynx). There is redness, pain, and swelling of your throat. HOME CARE   Drink enough fluids to keep your pee (urine) clear or pale yellow.  Only take medicine as told by your doctor.  You may get sick again if you do not take medicine as told. Finish your medicines, even if you start to feel better.  Do not take aspirin.  Rest.  Rinse your mouth (gargle) with salt water ( tsp of salt per 1 qt of water) every 1-2 hours. This will help the pain.  If you are not at risk for choking, you can suck on hard candy or sore throat lozenges. GET HELP IF:  You have large, tender lumps on your neck.  You have a rash.  You cough up green, yellow-brown, or bloody spit. GET HELP RIGHT AWAY IF:   You have a stiff neck.  You drool or cannot swallow liquids.  You throw up (vomit) or are not able to keep medicine or liquids down.  You have very bad pain that does not go away with medicine.  You have problems breathing (not from a stuffy nose). MAKE SURE YOU:   Understand these instructions.  Will watch your condition.  Will get help right away if you are not doing well or get worse.   This information is not intended to replace advice given to you by your health care provider. Make sure you discuss any questions you have with your health care provider.   Document Released: 06/22/2007 Document Revised: 10/24/2012 Document Reviewed: 09/10/2012 Elsevier Interactive Patient Education 2016 [employer] Your strep test is positive, you been treated with injectable penicillin, you will not need any further antibiotics.  You may still run a fever and have symptoms for 24-48 hours.  You can take over-the-counter Tylenol, ibuprofen for discomfort as needed

## 2015-06-05 NOTE — ED Notes (Signed)
Pt complains of her throat swelling, a dry cough and her voice being hoarse for three days

## 2015-06-05 NOTE — ED Provider Notes (Signed)
CSN: [id number]     Arrival date & time 06/05/15  0044 History   First MD Initiated Contact with Patient 06/05/15 [phone]     Chief Complaint  Patient presents with  . Oral Swelling     (Consider location/radiation/quality/duration/timing/severity/associated sxs/prior Treatment) HPI Comments: Patient has had 3 days of sore throat, throat swelling, dry cough, chills and voice change.  She does have a history of strep throat.  The history is provided by the patient.    Past Medical History  Diagnosis Date  . GERD (gastroesophageal reflux disease)   . Environmental allergies   . Asthma   . Mood disorder (HCC)   . Depression   . Anxiety    History reviewed. No pertinent past surgical history. Family History  Problem Relation Age of Onset  . Hypertension Mother   . Diabetes Other   . Hypertension Other    Social History  Substance Use Topics  . Smoking status: Former Smoker -- 0.00 packs/day    Types: Cigarettes    Quit date: 03/30/2014  . Smokeless tobacco: Never Used  . Alcohol Use: No   OB History    No data available     Review of Systems  Constitutional: Positive for chills. Negative for fever.  HENT: Positive for sore throat and trouble swallowing.   Respiratory: Negative for cough and shortness of breath.   Gastrointestinal: Negative for abdominal distention.  Neurological: Positive for headaches.  All other systems reviewed and are negative.     Allergies  Review of patient's allergies indicates no known allergies.  Home Medications   Prior to Admission medications   Medication Sig Start Date End Date Taking? Authorizing Provider  acetaminophen (TYLENOL) 325 MG tablet Take 2 tablets (650 mg total) by mouth every 6 (six) hours as needed for fever or headache. 08/30/14   Courteney Lyn Mackuen, MD  albuterol (PROVENTIL HFA;VENTOLIN HFA) 108 (90 BASE) MCG/ACT inhaler Inhale 2 puffs into the lungs every 4 (four) hours as needed. For shortness of breath or  wheezing    Historical Provider, MD  azithromycin (ZITHROMAX Z-PAK) 250 MG tablet Take 1 tablet (250 mg total) by mouth once. 08/30/14   Courteney Lyn Mackuen, MD  chlorpheniramine-HYDROcodone (TUSSIONEX PENNKINETIC ER) 10-8 MG/5ML LQCR Take 5 mLs by mouth every 12 (twelve) hours as needed for cough. Patient not taking: Reported on 04/14/2014 03/20/13   [name], MD  diphenoxylate-atropine (LOMOTIL) 2.5-0.025 MG tablet Take 1 tablet by mouth 4 (four) times daily as needed for diarrhea or loose stools. 04/14/15   Gilda Crease, MD  EPINEPHrine (EPIPEN 2-PAK IJ) Inject 1 Syringe as directed once as needed. For allergic reaction    Historical Provider, MD  HYDROcodone-acetaminophen (NORCO/VICODIN) 5-325 MG per tablet Take 1 tablet by mouth every 6 (six) hours as needed. 04/14/14   Tilden Fossa, MD  ibuprofen (ADVIL,MOTRIN) 400 MG tablet Take 1 tablet (400 mg total) by mouth every 6 (six) hours as needed. 11/08/14   Fayrene Helper, PA-C  methocarbamol (ROBAXIN) 500 MG tablet Take 1 tablet (500 mg total) by mouth 2 (two) times daily. 11/08/14   Fayrene Helper, PA-C  ondansetron (ZOFRAN) 4 MG tablet Take 1 tablet (4 mg total) by mouth every 8 (eight) hours as needed for nausea or vomiting. 08/30/14   Courteney Lyn Mackuen, MD  predniSONE (DELTASONE) 20 MG tablet 2 tabs po daily x 4 days 11/08/14   Fayrene Helper, PA-C  promethazine (PHENERGAN) 25 MG tablet Take 1 tablet (25 mg total) by mouth  every 6 (six) hours as needed for nausea or vomiting. 04/14/15   Gilda Creasehristopher J Pollina, MD  QUEtiapine (SEROQUEL) 100 MG tablet Take 100 mg by mouth at bedtime.    Historical Provider, MD   BP 108/68 mmHg  Pulse 64  Temp(Src) 98.7 F (37.1 C) (Oral)  Resp 18  Ht 5\' 3"  (1.6 m)  Wt 68.04 kg  BMI 26.58 kg/m2  SpO2 99%  LMP 06/04/2015 Physical Exam  Constitutional: She is oriented to person, place, and time. She appears well-developed and well-nourished.  HENT:  Head: Normocephalic.  Mouth/Throat: Uvula is  midline. No uvula swelling. Posterior oropharyngeal edema present. No oropharyngeal exudate or tonsillar abscesses.  Eyes: Pupils are equal, round, and reactive to light.  Neck: Normal range of motion.  Cardiovascular: Normal rate.   Pulmonary/Chest: Effort normal.  Abdominal: Soft. Bowel sounds are normal. She exhibits no distension.  Musculoskeletal: Normal range of motion.  Lymphadenopathy:    She has cervical adenopathy.  Neurological: She is alert and oriented to person, place, and time.  Skin: Skin is warm and dry.  Nursing note and vitals reviewed.   ED Course  Procedures (including critical care time) Labs Review Labs Reviewed  RAPID STREP SCREEN (NOT AT [facility]) - Abnormal; Notable for the following:    Streptococcus, Group A Screen (Direct) POSITIVE (*)    All other components within normal limits    Imaging Review No results found. I have personally reviewed and evaluated these images and lab results as part of my medical decision-making.   EKG Interpretation None     Patient strep test is positive.  Discussed treatment modalities with her.  She is opted for IM Bicillin MDM   Final diagnoses:  Strep pharyngitis         Earley FavorGail Lakevia Perris, NP 06/05/15 [phone]  Gilda Creasehristopher J Pollina, MD 06/05/15 [phone]

## 2015-07-20 ENCOUNTER — Emergency Department (HOSPITAL_COMMUNITY)
Admission: EM | Admit: 2015-07-20 | Discharge: 2015-07-20 | Disposition: A | Discharge status: Home / Self Care | Payer: No Typology Code available for payment source | Attending: Emergency Medicine | Admitting: Emergency Medicine

## 2015-07-20 ENCOUNTER — Emergency Department (HOSPITAL_COMMUNITY): Payer: No Typology Code available for payment source

## 2015-07-20 ENCOUNTER — Encounter (HOSPITAL_COMMUNITY): Payer: Self-pay | Admitting: Emergency Medicine

## 2015-07-20 DIAGNOSIS — J029 Acute pharyngitis, unspecified: Secondary | ICD-10-CM | POA: Insufficient documentation

## 2015-07-20 DIAGNOSIS — W2201XA Walked into wall, initial encounter: Secondary | ICD-10-CM | POA: Insufficient documentation

## 2015-07-20 DIAGNOSIS — Y929 Unspecified place or not applicable: Secondary | ICD-10-CM | POA: Insufficient documentation

## 2015-07-20 DIAGNOSIS — F17210 Nicotine dependence, cigarettes, uncomplicated: Secondary | ICD-10-CM | POA: Insufficient documentation

## 2015-07-20 DIAGNOSIS — J45909 Unspecified asthma, uncomplicated: Secondary | ICD-10-CM | POA: Insufficient documentation

## 2015-07-20 DIAGNOSIS — S60221A Contusion of right hand, initial encounter: Secondary | ICD-10-CM | POA: Insufficient documentation

## 2015-07-20 DIAGNOSIS — Y999 Unspecified external cause status: Secondary | ICD-10-CM | POA: Insufficient documentation

## 2015-07-20 DIAGNOSIS — Y939 Activity, unspecified: Secondary | ICD-10-CM | POA: Insufficient documentation

## 2015-07-20 DIAGNOSIS — F329 Major depressive disorder, single episode, unspecified: Secondary | ICD-10-CM | POA: Insufficient documentation

## 2015-07-20 MED ORDER — IBUPROFEN 800 MG PO TABS: 800.0000 mg | ORAL_TABLET | Freq: Three times a day (TID) | ORAL | Status: DC

## 2015-07-20 MED ORDER — IBUPROFEN 800 MG PO TABS
800.0000 mg | ORAL_TABLET | Freq: Once | ORAL | Status: AC
  Administered 2015-07-20: 800 mg via ORAL
  Filled 2015-07-20: qty 1

## 2015-07-20 NOTE — ED Notes (Signed)
Pt would also like to add that her throat is hurting and feels like she is straining to talk  Pt says she feels like her tonsils are swollen and has a hx of strep
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
## 2015-07-20 NOTE — Discharge Instructions (Signed)

## 2015-07-20 NOTE — ED Notes (Signed)
Pt reports understanding of discharge information. No questions at time of discharge 
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
## 2015-10-12 ENCOUNTER — Emergency Department (HOSPITAL_COMMUNITY): Payer: Self-pay

## 2015-10-12 ENCOUNTER — Emergency Department (HOSPITAL_COMMUNITY)
Admission: EM | Admit: 2015-10-12 | Discharge: 2015-10-12 | Disposition: A | Discharge status: Home / Self Care | Payer: Self-pay | Attending: Emergency Medicine | Admitting: Emergency Medicine

## 2015-10-12 ENCOUNTER — Encounter (HOSPITAL_COMMUNITY): Payer: Self-pay | Admitting: Emergency Medicine

## 2015-10-12 DIAGNOSIS — Y929 Unspecified place or not applicable: Secondary | ICD-10-CM | POA: Insufficient documentation

## 2015-10-12 DIAGNOSIS — Y939 Activity, unspecified: Secondary | ICD-10-CM | POA: Insufficient documentation

## 2015-10-12 DIAGNOSIS — S6391XA Sprain of unspecified part of right wrist and hand, initial encounter: Secondary | ICD-10-CM | POA: Insufficient documentation

## 2015-10-12 DIAGNOSIS — W2201XA Walked into wall, initial encounter: Secondary | ICD-10-CM | POA: Insufficient documentation

## 2015-10-12 DIAGNOSIS — J45909 Unspecified asthma, uncomplicated: Secondary | ICD-10-CM | POA: Insufficient documentation

## 2015-10-12 DIAGNOSIS — J069 Acute upper respiratory infection, unspecified: Secondary | ICD-10-CM | POA: Insufficient documentation

## 2015-10-12 DIAGNOSIS — Y999 Unspecified external cause status: Secondary | ICD-10-CM | POA: Insufficient documentation

## 2015-10-12 DIAGNOSIS — F17210 Nicotine dependence, cigarettes, uncomplicated: Secondary | ICD-10-CM | POA: Insufficient documentation

## 2015-10-12 MED ORDER — ALBUTEROL SULFATE HFA 108 (90 BASE) MCG/ACT IN AERS: 2.0000 | INHALATION_SPRAY | RESPIRATORY_TRACT | 0 refills | Status: DC | PRN

## 2015-10-12 NOTE — ED Triage Notes (Signed)
Pt from home with complaints of cough and headache. Pt states that the cough is productive with green sputum and has been present for about 3 days. Pt has hx of asthma. Pt has diminished lung sounds    Pt also has complaints of right hand pain following punching a window on May 30 and punching a wall about 2 weeks ago. Pt states she has had pain and numbness in that hand on and off since then

## 2015-10-12 NOTE — ED Provider Notes (Signed)
[facility]-EMERGENCY DEPT Provider Note   CSN: [id number] Arrival date & time: 10/12/15  5621  By signing my name below, I, Morgan Villegas, attest that this documentation has been prepared under the direction and in the presence of Morgan Rhine, MD. Electronically Signed: Sonum Villegas, Neurosurgeon. 10/12/15. 1:37 AM.  History   Chief Complaint Chief Complaint  Patient presents with  . Cough  . Hand Pain    The history is provided by the patient. No language interpreter was used.  Cough  This is a new problem. The current episode started more than 2 days ago. The problem occurs every few minutes. The problem has not changed since onset.The cough is productive of sputum. There has been no fever. Associated symptoms include rhinorrhea, shortness of breath and wheezing. Pertinent negatives include no sore throat. She is a smoker. Her past medical history is significant for asthma.  Hand Pain  This is a new problem. The current episode started more than 1 week ago. The problem has not changed since onset.Associated symptoms include shortness of breath. Pertinent negatives include no abdominal pain.   HPI Comments: Morgan Villegas is a 20 y.o. female with past medical history of asthma and allergies who presents to the Emergency Department complaining of intermittent cough productive of sputum for the past 4 days. She reports associated rhinorrhea, chest tightness, SOB, and wheezing. She denies fever, hemoptysis, abdominal pain, leg swelling, sore throat.   She also complains of sharp right hand pain for the past 2 weeks after punching glass and/or a wall. She reports associated intermittent paresthesias/numbness.   Past Medical History:  Diagnosis Date  . Anxiety   . Asthma   . Depression   . Environmental allergies   . GERD (gastroesophageal reflux disease)   . Mood disorder (HCC)     There are no active problems to display for this patient.   History reviewed. No pertinent surgical  history.  OB History    No data available       Home Medications    Prior to Admission medications   Medication Sig Start Date End Date Taking? Authorizing Provider  acetaminophen (TYLENOL) 325 MG tablet Take 2 tablets (650 mg total) by mouth every 6 (six) hours as needed for fever or headache. 08/30/14   Morgan Lyn Mackuen, MD  albuterol (PROVENTIL HFA;VENTOLIN HFA) 108 (90 BASE) MCG/ACT inhaler Inhale 2 puffs into the lungs every 4 (four) hours as needed. For shortness of breath or wheezing    Historical Provider, MD  azithromycin (ZITHROMAX Z-PAK) 250 MG tablet Take 1 tablet (250 mg total) by mouth once. 08/30/14   Morgan Lyn Mackuen, MD  chlorpheniramine-HYDROcodone (TUSSIONEX PENNKINETIC ER) 10-8 MG/5ML LQCR Take 5 mLs by mouth every 12 (twelve) hours as needed for cough. Patient not taking: Reported on 04/14/2014 03/20/13   Morgan Rhine, MD  diphenoxylate-atropine (LOMOTIL) 2.5-0.025 MG tablet Take 1 tablet by mouth 4 (four) times daily as needed for diarrhea or loose stools. 04/14/15   Morgan Crease, MD  EPINEPHrine (EPIPEN 2-PAK IJ) Inject 1 Syringe as directed once as needed. For allergic reaction    Historical Provider, MD  HYDROcodone-acetaminophen (NORCO/VICODIN) 5-325 MG per tablet Take 1 tablet by mouth every 6 (six) hours as needed. 04/14/14   Morgan Fossa, MD  ibuprofen (ADVIL,MOTRIN) 800 MG tablet Take 1 tablet (800 mg total) by mouth 3 (three) times daily. 07/20/15   Morgan Hong, MD  methocarbamol (ROBAXIN) 500 MG tablet Take 1 tablet (500 mg total) by mouth 2 (two)  times daily. 11/08/14   Morgan Helper, PA-C  ondansetron (ZOFRAN) 4 MG tablet Take 1 tablet (4 mg total) by mouth every 8 (eight) hours as needed for nausea or vomiting. 08/30/14   Morgan Lyn Mackuen, MD  predniSONE (DELTASONE) 20 MG tablet 2 tabs po daily x 4 days 11/08/14   Morgan Helper, PA-C  promethazine (PHENERGAN) 25 MG tablet Take 1 tablet (25 mg total) by mouth every 6 (six) hours as needed for  nausea or vomiting. 04/14/15   Morgan Crease, MD  QUEtiapine (SEROQUEL) 100 MG tablet Take 100 mg by mouth at bedtime.    Historical Provider, MD    Family History Family History  Problem Relation Age of Onset  . Hypertension Mother   . Diabetes Other   . Hypertension Other     Social History Social History  Substance Use Topics  . Smoking status: Current Every Day Smoker    Packs/day: 0.00    Types: Cigarettes    Last attempt to quit: 03/30/2014  . Smokeless tobacco: Never Used  . Alcohol use Yes     Comment: occ     Allergies   Review of patient's allergies indicates no known allergies.   Review of Systems Review of Systems  Constitutional: Negative for fever.  HENT: Positive for rhinorrhea. Negative for sore throat.   Respiratory: Positive for cough, chest tightness, shortness of breath and wheezing.   Cardiovascular: Negative for leg swelling.  Gastrointestinal: Negative for abdominal pain.  Musculoskeletal: Positive for arthralgias.     Physical Exam Updated Vital Signs BP 112/77 (BP Location: Right Arm)   Pulse 87   Temp 98.6 F (37 C) (Oral)   Resp 16   Ht 5\' 3"  (1.6 m)   Wt 150 lb (68 kg)   LMP 10/03/2015   SpO2 100%   BMI 26.57 kg/m   Physical Exam  CONSTITUTIONAL: Well developed/well nourished HEAD: Normocephalic/atraumatic EYES: EOMI/PERRL ENMT: Mucous membranes moist NECK: supple no meningeal signs SPINE/BACK:entire spine nontender CV: S1/S2 noted, no murmurs/rubs/gallops noted LUNGS: scattered wheeze but otherwise lungs are clear to auscultation bilaterally, no apparent distress ABDOMEN: soft, nontender, no rebound or guarding, bowel sounds noted throughout abdomen GU:no cva tenderness NEURO: Pt is awake/alert/appropriate, moves all extremitiesx4.  No facial droop.   EXTREMITIES: pulses normal/equal, full ROM, mild tenderness to right hand, no deformity, no lacerations, no abrasions noted.  SKIN: warm, color normal PSYCH: no  abnormalities of mood noted, alert and oriented to situation  ED Treatments / Results  DIAGNOSTIC STUDIES: Oxygen Saturation is 100% on RA, normal by my interpretation.    COORDINATION OF CARE: 1:37 AM Discussed treatment plan with pt at bedside and pt agreed to plan.   Labs (all labs ordered are listed, but only abnormal results are displayed) Labs Reviewed - No data to display  EKG  EKG Interpretation None       Radiology Dg Hand Complete Right  Result Date: 10/12/2015 CLINICAL DATA:  Punched wall, with chronic right hand pain and numbness along the third through fifth metacarpophalangeal joints. Subsequent encounter. EXAM: RIGHT HAND - COMPLETE 3+ VIEW COMPARISON:  Right hand radiographs performed 07/20/2015 FINDINGS: There is no evidence of fracture or dislocation. The joint spaces are preserved. The carpal rows are intact, and demonstrate normal alignment. The soft tissues are unremarkable in appearance. IMPRESSION: No evidence of fracture or dislocation. Electronically Signed   By: Roanna Raider M.D.   On: 10/12/2015 01:47    Procedures Procedures (including critical care time)  Medications Ordered in ED Medications - No data to display   Initial Impression / Assessment and Plan / ED Course  I have reviewed the triage vital signs and the nursing notes.  Pertinent maging results that were available during my care of the patient were reviewed by me and considered in my medical decision making (see chart for details).  Clinical Course    Pt well appearing Xray negative Advised her to quit smoking Albuterol inhaler given to patient  Final Clinical Impressions(s) / ED Diagnoses   Final diagnoses:  Sprain of right hand, initial encounter  URI (upper respiratory infection)    New Prescriptions Discharge Medication List as of 10/12/2015  1:59 AM      I personally performed the services described in this documentation, which was scribed in my presence. The  recorded information has been reviewed and is accurate.        Morgan Rhineonald Imani Fiebelkorn, MD 10/12/15 [phone]

## 2015-10-12 NOTE — ED Notes (Signed)
Pt ambulatory and independent at discharge.  Verbalized understanding of discharge instructions 

## 2017-01-22 ENCOUNTER — Other Ambulatory Visit: Payer: Self-pay

## 2017-01-22 DIAGNOSIS — B359 Dermatophytosis, unspecified: Secondary | ICD-10-CM | POA: Insufficient documentation

## 2017-01-22 NOTE — ED Triage Notes (Signed)
Reports noticed rash 2 weeks ago on here chest that came and went.  Now on hand arms and trunk.

## 2017-01-23 ENCOUNTER — Emergency Department
Admission: EM | Admit: 2017-01-23 | Discharge: 2017-01-23 | Disposition: A | Discharge status: Home / Self Care | Payer: Self-pay | Attending: Emergency Medicine | Admitting: Emergency Medicine

## 2017-01-23 DIAGNOSIS — B359 Dermatophytosis, unspecified: Secondary | ICD-10-CM

## 2017-01-23 MED ORDER — KETOCONAZOLE 2 % EX CREA
TOPICAL_CREAM | Freq: Two times a day (BID) | CUTANEOUS | Status: DC
  Administered 2017-01-23: 1 via TOPICAL
  Filled 2017-01-23: qty 15

## 2017-01-23 MED ORDER — HYDROXYZINE HCL 25 MG PO TABS
50.0000 mg | ORAL_TABLET | Freq: Once | ORAL | Status: AC
  Administered 2017-01-23: 50 mg via ORAL
  Filled 2017-01-23: qty 2

## 2017-01-23 MED ORDER — HYDROXYZINE HCL 25 MG PO TABS: 25.0000 mg | ORAL_TABLET | Freq: Four times a day (QID) | ORAL | 0 refills | Status: DC | PRN

## 2017-01-23 MED ORDER — KETOCONAZOLE 2 % EX CREA: 1.0000 "application " | TOPICAL_CREAM | Freq: Two times a day (BID) | CUTANEOUS | 0 refills | Status: DC

## 2017-01-23 NOTE — ED Provider Notes (Signed)
[facility] Emergency Department Provider Note   ____________________________________________   First MD Initiated Contact with Patient 01/23/17 [phone]     (approximate)  I have reviewed the triage vital signs and the nursing notes.   HISTORY  Chief Complaint Rash    HPI Morgan Villegas is a 22 y.o. female who presents to the ED with a chief complaint of rash.  Patient is currently living at a hotel and noted rash starting on her chest and trunk 2 weeks ago.  Describes rash as circular, crusty and very itchy.  States now rash has spread to her arms, legs, buttocks and feet.  Denies new exposures. Specifically, denies new foods, medicines including over the counters, detergents, soaps, lotions.  Denies being around animals.  Denies fever, chills, chest pain, shortness of breath, abdominal pain, nausea, vomiting.  Denies recent travel or trauma.  Has not tried anything for her itchiness.  Has tried alcohol rubs and Vaseline lotion for her rash.   Past Medical History:  Diagnosis Date  . Anxiety   . Asthma   . Depression   . Environmental allergies   . GERD (gastroesophageal reflux disease)   . Mood disorder (HCC)     There are no active problems to display for this patient.   No past surgical history on file.  Prior to Admission medications   Medication Sig Start Date End Date Taking? Authorizing Provider  acetaminophen (TYLENOL) 325 MG tablet Take 2 tablets (650 mg total) by mouth every 6 (six) hours as needed for fever or headache. 08/30/14   Mackuen, Courteney Lyn, MD  albuterol (PROVENTIL HFA;VENTOLIN HFA) 108 (90 Base) MCG/ACT inhaler Inhale 2 puffs into the lungs every 2 (two) hours as needed for wheezing or shortness of breath (cough). 10/12/15   Zadie RhineWickline, Donald, MD  azithromycin (ZITHROMAX Z-PAK) 250 MG tablet Take 1 tablet (250 mg total) by mouth once. 08/30/14   Mackuen, Courteney Lyn, MD  diphenoxylate-atropine (LOMOTIL) 2.5-0.025 MG tablet Take 1  tablet by mouth 4 (four) times daily as needed for diarrhea or loose stools. 04/14/15   Gilda CreasePollina, Christopher J, MD  EPINEPHrine (EPIPEN 2-PAK IJ) Inject 1 Syringe as directed once as needed. For allergic reaction    [provider]  hydrOXYzine (ATARAX/VISTARIL) 25 MG tablet Take 1 tablet (25 mg total) by mouth every 6 (six) hours as needed for itching. 01/23/17   Irean HongSung, Jade J, MD  ketoconazole (NIZORAL) 2 % cream Apply 1 application topically 2 (two) times daily. 01/23/17   Irean HongSung, Jade J, MD  methocarbamol (ROBAXIN) 500 MG tablet Take 1 tablet (500 mg total) by mouth 2 (two) times daily. 11/08/14   Fayrene Helperran, Bowie, PA-C  QUEtiapine (SEROQUEL) 100 MG tablet Take 100 mg by mouth at bedtime.    [provider]    Allergies Patient has no known allergies.  Family History  Problem Relation Age of Onset  . Hypertension Mother   . Diabetes Other   . Hypertension Other     Social History Social History   Tobacco Use  . Smoking status: Current Every Day Smoker    Packs/day: 0.00    Types: Cigarettes    Last attempt to quit: 03/30/2014    Years since quitting: 2.8  . Smokeless tobacco: Never Used  Substance Use Topics  . Alcohol use: Yes    Comment: occ  . Drug use: No    Review of Systems  Constitutional: No fever/chills. Eyes: No visual changes. ENT: No sore throat. Cardiovascular: Denies  chest pain. Respiratory: Denies shortness of breath. Gastrointestinal: No abdominal pain.  No nausea, no vomiting.  No diarrhea.  No constipation. Genitourinary: Negative for dysuria. Musculoskeletal: Negative for back pain. Skin: Positive for rash. Neurological: Negative for headaches, focal weakness or numbness.   ____________________________________________   PHYSICAL EXAM:  VITAL SIGNS: ED Triage Vitals  Enc Vitals Group     BP 01/22/17 2357 109/68     Pulse Rate 01/22/17 2357 69     Resp 01/22/17 2357 18     Temp 01/22/17 2357 98.4 F (36.9 C)     Temp Source  01/22/17 2357 Oral     SpO2 01/22/17 2357 99 %     Weight 01/22/17 2355 161 lb (73 kg)     Height 01/22/17 2355 5\' 4"  (1.626 m)     Head Circumference --      Peak Flow --      Pain Score --      Pain Loc --      Pain Edu? --      Excl. in GC? --     Constitutional: Alert and oriented. Well appearing and in no acute distress. Eyes: Conjunctivae are normal. PERRL. EOMI. Head: Atraumatic. Nose: No congestion/rhinnorhea. Mouth/Throat: There is no tongue or facial angioedema.  Mucous membranes are moist.  There is no hoarse or muffled voice.  There is no drooling. Neck: No stridor.  No neck swelling. Cardiovascular: Normal rate, regular rhythm. Grossly normal heart sounds.  Good peripheral circulation. Respiratory: Normal respiratory effort.  No retractions. Lungs CTAB. Gastrointestinal: Soft and nontender. No distention. No abdominal bruits. No CVA tenderness. Musculoskeletal: No lower extremity tenderness nor edema.  No joint effusions. Neurologic:  Normal speech and language. No gross focal neurologic deficits are appreciated. No gait instability. Skin:  Skin is warm, dry and intact.  Annular, ringlike, red and crusty patches to trunk and extremities.  No petechiae.  Psychiatric: Mood and affect are normal. Speech and behavior are normal.  ____________________________________________   LABS (all labs ordered are listed, but only abnormal results are displayed)  Labs Reviewed - No data to display ____________________________________________  EKG  None ____________________________________________  RADIOLOGY  No results found.  ____________________________________________   PROCEDURES  Procedure(s) performed: None  Procedures  Critical Care performed: No  ____________________________________________   INITIAL IMPRESSION / ASSESSMENT AND PLAN / ED COURSE  As part of my medical decision making, I reviewed the following data within the electronic MEDICAL RECORD NUMBER  History obtained from family, Nursing notes reviewed and incorporated and Notes from prior ED visits.   22 year old female presenting with a 2-week history of rash.  Clinical appearance consistent with tinea corporis.  Will prescribe ketoconazole topical cream, Atarax for itching, and refer to dermatology for outpatient follow-up.  Strict return precautions given.  Patient and visitor verbalized understanding and agree with plan of care.      ____________________________________________   FINAL CLINICAL IMPRESSION(S) / ED DIAGNOSES  Final diagnoses:  Ringworm     ED Discharge Orders        Ordered    ketoconazole (NIZORAL) 2 % cream  2 times daily     01/23/17 0356    hydrOXYzine (ATARAX/VISTARIL) 25 MG tablet  Every 6 hours PRN     01/23/17 0356       Note:  This document was prepared using Dragon voice recognition software and may include unintentional dictation errors.    Irean Hong, MD 01/23/17 [phone]

## 2017-01-23 NOTE — Discharge Instructions (Signed)
1.  You may take Atarax as needed for itching. 2.  Antifungal cream twice daily to affected areas.  You may need to continue this for 2 weeks until your skin clears. 3.  Return to the ER for worsening symptoms, persistent vomiting, fever, infected skin, or other concerns.

## 2017-07-06 ENCOUNTER — Encounter (HOSPITAL_COMMUNITY): Payer: Self-pay | Admitting: Emergency Medicine

## 2017-07-06 ENCOUNTER — Emergency Department (HOSPITAL_COMMUNITY)
Admission: EM | Admit: 2017-07-06 | Discharge: 2017-07-06 | Disposition: A | Discharge status: Home / Self Care | Payer: Self-pay | Attending: Emergency Medicine | Admitting: Emergency Medicine

## 2017-07-06 ENCOUNTER — Other Ambulatory Visit: Payer: Self-pay

## 2017-07-06 DIAGNOSIS — J45909 Unspecified asthma, uncomplicated: Secondary | ICD-10-CM | POA: Insufficient documentation

## 2017-07-06 DIAGNOSIS — X500XXA Overexertion from strenuous movement or load, initial encounter: Secondary | ICD-10-CM | POA: Insufficient documentation

## 2017-07-06 DIAGNOSIS — S96912A Strain of unspecified muscle and tendon at ankle and foot level, left foot, initial encounter: Secondary | ICD-10-CM | POA: Insufficient documentation

## 2017-07-06 DIAGNOSIS — Y929 Unspecified place or not applicable: Secondary | ICD-10-CM | POA: Insufficient documentation

## 2017-07-06 DIAGNOSIS — F17210 Nicotine dependence, cigarettes, uncomplicated: Secondary | ICD-10-CM | POA: Insufficient documentation

## 2017-07-06 DIAGNOSIS — Y9367 Activity, basketball: Secondary | ICD-10-CM | POA: Insufficient documentation

## 2017-07-06 DIAGNOSIS — Z79899 Other long term (current) drug therapy: Secondary | ICD-10-CM | POA: Insufficient documentation

## 2017-07-06 DIAGNOSIS — Y999 Unspecified external cause status: Secondary | ICD-10-CM | POA: Insufficient documentation

## 2017-07-06 DIAGNOSIS — S46819A Strain of other muscles, fascia and tendons at shoulder and upper arm level, unspecified arm, initial encounter: Secondary | ICD-10-CM | POA: Insufficient documentation

## 2017-07-06 MED ORDER — IBUPROFEN 200 MG PO TABS
600.0000 mg | ORAL_TABLET | Freq: Once | ORAL | Status: AC
  Administered 2017-07-06: 600 mg via ORAL
  Filled 2017-07-06: qty 3

## 2017-07-06 MED ORDER — CYCLOBENZAPRINE HCL 10 MG PO TABS
10.0000 mg | ORAL_TABLET | Freq: Once | ORAL | Status: AC
  Administered 2017-07-06: 10 mg via ORAL
  Filled 2017-07-06: qty 1

## 2017-07-06 MED ORDER — CYCLOBENZAPRINE HCL 10 MG PO TABS: 10.0000 mg | ORAL_TABLET | Freq: Three times a day (TID) | ORAL | 0 refills | Status: DC | PRN

## 2017-07-06 MED ORDER — IBUPROFEN 600 MG PO TABS: 600.0000 mg | ORAL_TABLET | Freq: Four times a day (QID) | ORAL | 0 refills | Status: DC

## 2017-07-06 NOTE — Discharge Instructions (Addendum)
Your vital signs have been reviewed.  We will is 98% on room air.  Your examination is negative for any acute neurologic or vascular deficit.  I suspect that the tightness in your neck, shoulders, and upper torso are related probably to posture changes.  The pain and swelling of your left lower extremity may be related to your walking pattern following your fracture.  Please use the ankle splint over the next 5 to 7 days.  Use it after that 7-day.  If you going to be on your feet a lot or extremely active.  Use ibuprofen with breakfast, lunch, dinner, and at bedtime.  Use Flexeril 3 times daily if needed for spasm pain. This medication may cause drowsiness. Please do not drink, drive, or participate in activity that requires concentration while taking this medication.  Please call the [facility] to set up an appointment, and establish a primary physician to assist you with the multiple medical complaints.

## 2017-07-06 NOTE — ED Provider Notes (Signed)
[facility]-EMERGENCY DEPT Provider Note   CSN: [id number] Arrival date & time: 07/06/17  1404     History   Chief Complaint Chief Complaint  Patient presents with  . Body Pain    HPI Morgan Villegas is a 22 y.o. female.  Patient is a 22 year old female who presents to the emergency department with a complaint of left foot pain and body pain.  The patient states that she has been having some swelling and stiffness and pain of her left foot and leg for nearly 2 years since a fracture of the foot.  She thought that it continued because of playing basketball and being active in sports.  In the last 1 to 1-1/2 weeks she has been having bilateral knee pain, left foot pain, that is getting worse.  The patient also complains of pain and tightness of the upper trapezius area involving her shoulder area.  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  The patient denies dropping any objects.  She has not had any injury to the head or neck.  She has not had any operations involving the neck or the shoulder areas.  She denies doing any excessive lifting, pushing, or pulling.     Past Medical History:  Diagnosis Date  . Anxiety   . Asthma   . Depression   . Environmental allergies   . GERD (gastroesophageal reflux disease)   . Mood disorder (HCC)     There are no active problems to display for this patient.   History reviewed. No pertinent surgical history.   OB History   None      Home Medications    Prior to Admission medications   Medication Sig Start Date End Date Taking? Authorizing Provider  acetaminophen (TYLENOL) 325 MG tablet Take 2 tablets (650 mg total) by mouth every 6 (six) hours as needed for fever or headache. 08/30/14   Mackuen, Courteney Lyn, MD  albuterol (PROVENTIL HFA;VENTOLIN HFA) 108 (90 Base) MCG/ACT inhaler Inhale 2 puffs into the lungs every 2 (two) hours as needed for wheezing or shortness of  breath (cough). 10/12/15   [name], MD  azithromycin (ZITHROMAX Z-PAK) 250 MG tablet Take 1 tablet (250 mg total) by mouth once. 08/30/14   Mackuen, Courteney Lyn, MD  diphenoxylate-atropine (LOMOTIL) 2.5-0.025 MG tablet Take 1 tablet by mouth 4 (four) times daily as needed for diarrhea or loose stools. 04/14/15   Gilda Crease, MD  EPINEPHrine (EPIPEN 2-PAK IJ) Inject 1 Syringe as directed once as needed. For allergic reaction    [provider]  hydrOXYzine (ATARAX/VISTARIL) 25 MG tablet Take 1 tablet (25 mg total) by mouth every 6 (six) hours as needed for itching. 01/23/17   Irean Hong, MD  ketoconazole (NIZORAL) 2 % cream Apply 1 application topically 2 (two) times daily. 01/23/17   Irean Hong, MD  methocarbamol (ROBAXIN) 500 MG tablet Take 1 tablet (500 mg total) by mouth 2 (two) times daily. 11/08/14   Fayrene Helper, PA-C  QUEtiapine (SEROQUEL) 100 MG tablet Take 100 mg by mouth at bedtime.    [provider]  cetirizine (ZYRTEC) 10 MG tablet Take 10 mg by mouth daily.  06/01/11  [provider]  OMEPRAZOLE PO Take 1 capsule by mouth daily.  06/01/11  [provider]    Family History Family History  Problem Relation Age of Onset  . Hypertension Mother   . Diabetes Other   .  Hypertension Other     Social History Social History   Tobacco Use  . Smoking status: Current Every Day Smoker    Packs/day: 0.00    Types: Cigarettes    Last attempt to quit: 03/30/2014    Years since quitting: 3.2  . Smokeless tobacco: Never Used  Substance Use Topics  . Alcohol use: Yes    Comment: occ  . Drug use: No     Allergies   Patient has no known allergies.   Review of Systems Review of Systems  Constitutional: Positive for activity change.       All ROS Neg except as noted in HPI  HENT: Negative for nosebleeds.   Eyes: Negative for photophobia and discharge.  Respiratory: Negative for cough, shortness of breath and wheezing.     Cardiovascular: Negative for chest pain and palpitations.  Gastrointestinal: Negative for abdominal pain and blood in stool.  Genitourinary: Negative for dysuria, frequency and hematuria.  Musculoskeletal: Positive for arthralgias and myalgias. Negative for back pain and neck pain.  Skin: Negative.   Neurological: Negative for dizziness, seizures and speech difficulty.  Psychiatric/Behavioral: Negative for confusion and hallucinations.     Physical Exam Updated Vital Signs BP 131/77 (BP Location: Right Arm)   Pulse (!) 105   Temp 98.8 F (37.1 C) (Oral)   Resp 20   SpO2 98%   Physical Exam  Constitutional: She is oriented to person, place, and time. She appears well-developed and well-nourished.  Non-toxic appearance.  HENT:  Head: Normocephalic.  Right Ear: Tympanic membrane and external ear normal.  Left Ear: Tympanic membrane and external ear normal.  Eyes: Pupils are equal, round, and reactive to light. EOM and lids are normal.  Neck: Normal range of motion. Neck supple. Carotid bruit is not present.  Cardiovascular: Normal rate, regular rhythm, normal heart sounds, intact distal pulses and normal pulses.  Pulmonary/Chest: Breath sounds normal. No respiratory distress.  Abdominal: Soft. Bowel sounds are normal. There is no tenderness. There is no guarding.  Musculoskeletal: Normal range of motion.  There is tightness and tenseness of the left greater than right trapezius.  There is good range of motion of right and left hip, right and left knee.  There is soreness of the left knee, but no effusion appreciated.  There is no deformity of the tibial area.  The Achilles tendon is intact bilaterally.  The dorsalis pedis and posterior tibial pulses are 2+ bilaterally.  There is no swelling noted of the foot.  There is pain with attempted range of motion of the left ankle.  There is full range of motion of the toes.  Capillary refill is less than 2 seconds.    Lymphadenopathy:        Head (right side): No submandibular adenopathy present.       Head (left side): No submandibular adenopathy present.    She has no cervical adenopathy.  Neurological: She is alert and oriented to person, place, and time. She has normal strength. No cranial nerve deficit or sensory deficit.  Skin: Skin is warm and dry.  Psychiatric: She has a normal mood and affect. Her speech is normal.  Nursing note and vitals reviewed.    ED Treatments / Results  Labs (all labs ordered are listed, but only abnormal results are displayed) Labs Reviewed - No data to display  EKG None  Radiology No results found.  Procedures Procedures (including critical care time)  Medications Ordered in ED Medications - No data to display  Initial Impression / Assessment and Plan / ED Course  I have reviewed the triage vital signs and the nursing notes.  Pertinent labs & imaging results that were available during my care of the patient were reviewed by me and considered in my medical decision making (see chart for details).       Final Clinical Impressions(s) / ED Diagnoses MDM  Vital signs within normal limits.  Pulse oximetry is 98% on room air.  Within normal limits by my interpretation.  The patient has tightness and tenseness of the  trapezius area.  The patient has pain involving the knee on the left and the ankle on the left.  There is no effusion noted.  And no history of direct trauma.  I suspect that the patient has pain related to changes in her walking pattern related to the previous fracture.  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  No acute neurovascular deficit appreciated at this time.  Patient will be treated with an anti-inflammatory pain medication, as well as a muscle relaxer.  Patient will receive ASO splint. Pt to follow up with  primary MD or the [facility].   Final diagnoses:  Trapezius muscle strain, unspecified laterality, initial encounter  Strain of left ankle, initial encounter    ED Discharge Orders        Ordered    cyclobenzaprine (FLEXERIL) 10 MG tablet  3 times daily PRN     07/06/17 1605    ibuprofen (ADVIL,MOTRIN) 600 MG tablet  4 times daily     07/06/17 1605       Ivery Quale, PA-C 07/06/17 1611    Arby Barrette, MD 07/06/17 [phone]

## 2017-07-06 NOTE — ED Triage Notes (Signed)
Pt complaint of bilateral knee pain and left foot pain for a few days; "thought it was from playing basketball; now my whole left side is tight." Pt ambulatory.

## 2017-09-01 ENCOUNTER — Other Ambulatory Visit: Payer: Self-pay

## 2017-09-01 ENCOUNTER — Encounter (HOSPITAL_COMMUNITY): Payer: Self-pay | Admitting: *Deleted

## 2017-09-01 ENCOUNTER — Emergency Department (HOSPITAL_COMMUNITY)
Admission: EM | Admit: 2017-09-01 | Discharge: 2017-09-01 | Disposition: A | Discharge status: Home / Self Care | Payer: Medicaid Other | Attending: Emergency Medicine | Admitting: Emergency Medicine

## 2017-09-01 DIAGNOSIS — Z79899 Other long term (current) drug therapy: Secondary | ICD-10-CM | POA: Insufficient documentation

## 2017-09-01 DIAGNOSIS — K219 Gastro-esophageal reflux disease without esophagitis: Secondary | ICD-10-CM | POA: Insufficient documentation

## 2017-09-01 DIAGNOSIS — J45909 Unspecified asthma, uncomplicated: Secondary | ICD-10-CM | POA: Insufficient documentation

## 2017-09-01 DIAGNOSIS — F17210 Nicotine dependence, cigarettes, uncomplicated: Secondary | ICD-10-CM | POA: Insufficient documentation

## 2017-09-01 LAB — CBC
HCT: 33.1 % — ABNORMAL LOW (ref 36.0–46.0)
HEMOGLOBIN: 11.4 g/dL — AB (ref 12.0–15.0)
MCH: 28.5 pg (ref 26.0–34.0)
MCHC: 34.4 g/dL (ref 30.0–36.0)
MCV: 82.8 fL (ref 78.0–100.0)
Platelets: 375 10*3/uL (ref 150–400)
RBC: 4 MIL/uL (ref 3.87–5.11)
RDW: 14.7 % (ref 11.5–15.5)
WBC: 11.6 10*3/uL — AB (ref 4.0–10.5)

## 2017-09-01 MED ORDER — OMEPRAZOLE 20 MG PO CPDR: 20.0000 mg | DELAYED_RELEASE_CAPSULE | Freq: Every day | ORAL | 0 refills | Status: DC

## 2017-09-01 MED ORDER — ALBUTEROL SULFATE HFA 108 (90 BASE) MCG/ACT IN AERS
2.0000 | INHALATION_SPRAY | RESPIRATORY_TRACT | Status: DC | PRN
  Administered 2017-09-01: 2 via RESPIRATORY_TRACT
  Filled 2017-09-01: qty 6.7

## 2017-09-01 NOTE — ED Provider Notes (Signed)
[facility] EMERGENCY DEPARTMENT Provider Note   CSN: [id number] Arrival date & time: 09/01/17  2057     History   Chief Complaint Chief Complaint  Patient presents with  . Gastroesophageal Reflux    HPI Morgan Villegas is a 22 y.o. female.  HPI Patient presents with around 3 to 4 days of cough and throat tightness.  States she tastes acid in her throat.  Also states that her throat will feel tight at times.  No real sputum production.  States she does have a history of asthma.  Does not have an inhaler currently.  No abdominal pain.  States that she has felt dizzy and lightheaded.  History of anemia.  States she has not recently had insurance and is been off her medicines but states she has insurance back now. Past Medical History:  Diagnosis Date  . Anxiety   . Asthma   . Depression   . Environmental allergies   . GERD (gastroesophageal reflux disease)   . Mood disorder (HCC)     There are no active problems to display for this patient.   History reviewed. No pertinent surgical history.   OB History   None      Home Medications    Prior to Admission medications   Medication Sig Start Date End Date Taking? Authorizing Provider  acetaminophen (TYLENOL) 325 MG tablet Take 2 tablets (650 mg total) by mouth every 6 (six) hours as needed for fever or headache. 08/30/14   Mackuen, Courteney Lyn, MD  albuterol (PROVENTIL HFA;VENTOLIN HFA) 108 (90 Base) MCG/ACT inhaler Inhale 2 puffs into the lungs every 2 (two) hours as needed for wheezing or shortness of breath (cough). 10/12/15   Zadie RhineWickline, Donald, MD  azithromycin (ZITHROMAX Z-PAK) 250 MG tablet Take 1 tablet (250 mg total) by mouth once. 08/30/14   Mackuen, Courteney Lyn, MD  cyclobenzaprine (FLEXERIL) 10 MG tablet Take 1 tablet (10 mg total) by mouth 3 (three) times daily as needed for muscle spasms. 07/06/17   Ivery QualeBryant, Hobson, PA-C  diphenoxylate-atropine (LOMOTIL) 2.5-0.025 MG tablet Take 1 tablet by mouth 4 (four) times  daily as needed for diarrhea or loose stools. 04/14/15   Gilda CreasePollina, Christopher J, MD  EPINEPHrine (EPIPEN 2-PAK IJ) Inject 1 Syringe as directed once as needed. For allergic reaction    [provider]  hydrOXYzine (ATARAX/VISTARIL) 25 MG tablet Take 1 tablet (25 mg total) by mouth every 6 (six) hours as needed for itching. 01/23/17   Irean HongSung, Jade J, MD  ibuprofen (ADVIL,MOTRIN) 600 MG tablet Take 1 tablet (600 mg total) by mouth 4 (four) times daily. 07/06/17   Ivery QualeBryant, Hobson, PA-C  ketoconazole (NIZORAL) 2 % cream Apply 1 application topically 2 (two) times daily. 01/23/17   Irean HongSung, Jade J, MD  methocarbamol (ROBAXIN) 500 MG tablet Take 1 tablet (500 mg total) by mouth 2 (two) times daily. 11/08/14   Fayrene Helperran, Bowie, PA-C  omeprazole (PRILOSEC) 20 MG capsule Take 1 capsule (20 mg total) by mouth daily. 09/01/17   Benjiman CorePickering, Apollonia Amini, MD  QUEtiapine (SEROQUEL) 100 MG tablet Take 100 mg by mouth at bedtime.    [provider]    Family History Family History  Problem Relation Age of Onset  . Hypertension Mother   . Diabetes Other   . Hypertension Other     Social History Social History   Tobacco Use  . Smoking status: Current Every Day Smoker    Packs/day: 0.00    Types: Cigarettes    Last attempt to  quit: 03/30/2014    Years since quitting: 3.4  . Smokeless tobacco: Never Used  Substance Use Topics  . Alcohol use: Yes    Comment: occ  . Drug use: No     Allergies   Patient has no known allergies.   Review of Systems Review of Systems  Constitutional: Negative for appetite change.  HENT: Positive for voice change.   Respiratory: Positive for cough.   Cardiovascular: Negative for chest pain.  Gastrointestinal: Negative for abdominal pain.  Genitourinary: Negative for flank pain.  Musculoskeletal: Negative for back pain.  Neurological: Positive for light-headedness.  Hematological: Negative for adenopathy.  Psychiatric/Behavioral: Negative for behavioral problems.      Physical Exam Updated Vital Signs BP 118/78 (BP Location: Left Arm)   Pulse 87   Temp 99 F (37.2 C) (Oral)   Resp 18   Ht 5\' 3"  (1.6 m)   Wt 73.5 kg   LMP 08/17/2017   SpO2 99%   BMI 28.70 kg/m   Physical Exam  Constitutional: She appears well-developed.  HENT:  Patient with a somewhat harsh voice.  Cardiovascular: Normal rate.  Pulmonary/Chest: Effort normal.  Somewhat harsh breath sounds.  Abdominal: Soft.  Musculoskeletal: She exhibits no edema.  Neurological: She is alert.  Skin: Capillary refill takes less than 2 seconds.  Psychiatric: She has a normal mood and affect.     ED Treatments / Results  Labs (all labs ordered are listed, but only abnormal results are displayed) Labs Reviewed  CBC - Abnormal; Notable for the following components:      Result Value   WBC 11.6 (*)    Hemoglobin 11.4 (*)    HCT 33.1 (*)    All other components within normal limits    EKG None  Radiology No results found.  Procedures Procedures (including critical care time)  Medications Ordered in ED Medications  albuterol (PROVENTIL HFA;VENTOLIN HFA) 108 (90 Base) MCG/ACT inhaler 2 puff (2 puffs Inhalation Given 09/01/17 2145)     Initial Impression / Assessment and Plan / ED Course  I have reviewed the triage vital signs and the nursing notes.  Pertinent labs & imaging results that were available during my care of the patient were reviewed by me and considered in my medical decision making (see chart for details).     Patient with harsh voice feeling she gets acid in her throat.  Could be a reflux component to it.  Also has had asthma and had some coughing.  Given inhaler and started on Prilosec.  Has history of anemia and was lightheaded.  Has only mild anemia.  Will have follow-up with PCP.  Discharge home.  Final Clinical Impressions(s) / ED Diagnoses   Final diagnoses:  Gastroesophageal reflux disease, esophagitis presence not specified    ED Discharge  Orders         Ordered    omeprazole (PRILOSEC) 20 MG capsule  Daily,   Status:  Discontinued     09/01/17 2246    omeprazole (PRILOSEC) 20 MG capsule  Daily     09/01/17 2255           Benjiman CorePickering, Jahmeek Shirk, MD 09/01/17 2311

## 2017-09-01 NOTE — Discharge Instructions (Signed)
Follow-up with a primary care doctor. °

## 2017-09-01 NOTE — ED Triage Notes (Signed)
Pt states that his acid reflux is causing him to be hoarse and to have n/v. Symptoms started 3-4 days ago,

## 2017-09-20 ENCOUNTER — Emergency Department (HOSPITAL_COMMUNITY)
Admission: EM | Admit: 2017-09-20 | Discharge: 2017-09-20 | Disposition: A | Discharge status: Home / Self Care | Payer: Managed Care, Other (non HMO) | Attending: Emergency Medicine | Admitting: Emergency Medicine

## 2017-09-20 ENCOUNTER — Other Ambulatory Visit: Payer: Self-pay

## 2017-09-20 ENCOUNTER — Encounter (HOSPITAL_COMMUNITY): Payer: Self-pay | Admitting: Emergency Medicine

## 2017-09-20 DIAGNOSIS — Z5321 Procedure and treatment not carried out due to patient leaving prior to being seen by health care provider: Secondary | ICD-10-CM | POA: Insufficient documentation

## 2017-09-20 DIAGNOSIS — R42 Dizziness and giddiness: Secondary | ICD-10-CM | POA: Diagnosis present

## 2017-09-20 NOTE — ED Notes (Signed)
Pt requesting work note stating she would be here the next two hours. Notified pt work notes cannot be given until d/c paperwork has been given by EDP. Pt got upset and left.

## 2017-09-20 NOTE — ED Triage Notes (Signed)
Pt reports intermittent dizziness, worse when bending down and standing up. Episodes have been ongoing for more than a week.

## 2017-09-21 ENCOUNTER — Emergency Department (HOSPITAL_COMMUNITY)
Admission: EM | Admit: 2017-09-21 | Discharge: 2017-09-22 | Disposition: A | Discharge status: Home / Self Care | Payer: Managed Care, Other (non HMO) | Attending: Emergency Medicine | Admitting: Emergency Medicine

## 2017-09-21 ENCOUNTER — Other Ambulatory Visit: Payer: Self-pay

## 2017-09-21 ENCOUNTER — Encounter (HOSPITAL_COMMUNITY): Payer: Self-pay | Admitting: Emergency Medicine

## 2017-09-21 DIAGNOSIS — R42 Dizziness and giddiness: Secondary | ICD-10-CM | POA: Diagnosis present

## 2017-09-21 DIAGNOSIS — J45909 Unspecified asthma, uncomplicated: Secondary | ICD-10-CM | POA: Diagnosis not present

## 2017-09-21 DIAGNOSIS — F17210 Nicotine dependence, cigarettes, uncomplicated: Secondary | ICD-10-CM | POA: Insufficient documentation

## 2017-09-21 NOTE — ED Triage Notes (Signed)
Pt reports dizziness and migraine headaches that began around 1 week ago. Pt reports taking ibuprofen and tylenol with no relief.

## 2017-09-22 LAB — I-STAT CHEM 8, ED
BUN: 12 mg/dL (ref 6–20)
CALCIUM ION: 1.21 mmol/L (ref 1.15–1.40)
CREATININE: 0.9 mg/dL (ref 0.44–1.00)
Chloride: 104 mmol/L (ref 98–111)
Glucose, Bld: 81 mg/dL (ref 70–99)
HEMATOCRIT: 37 % (ref 36.0–46.0)
HEMOGLOBIN: 12.6 g/dL (ref 12.0–15.0)
Potassium: 3.8 mmol/L (ref 3.5–5.1)
Sodium: 139 mmol/L (ref 135–145)
TCO2: 23 mmol/L (ref 22–32)

## 2017-09-22 LAB — I-STAT BETA HCG BLOOD, ED (MC, WL, AP ONLY): I-stat hCG, quantitative: <5 m[IU]/mL (ref <5)

## 2017-09-22 NOTE — Discharge Instructions (Addendum)
   RETURN IMMEDIATELY IF YOU HAVE ANY OF THE FOLLOWING (call 911): Increasing vertigo, earache, ear drainage, or loss of hearing.  Severe headache, blurred or double vision, or trouble walking.  Fainting or poorly responsive, extreme weakness, chest pain, or palpitations.  Fever, persistent vomiting, or dehydration.  Numbness, tingling, incoordination, or weakness of the limbs.  Change in speech, vision, swallowing, understanding, or other concerns.  

## 2017-09-22 NOTE — ED Provider Notes (Signed)
[facility] EMERGENCY DEPARTMENT Provider Note   CSN: [id number] Arrival date & time: 09/21/17  2248     History   Chief Complaint Chief Complaint  Patient presents with  . Dizziness    HPI Morgan Villegas is a 22 y.o. female.  The history is provided by the patient.  Dizziness  Quality:  Lightheadedness Severity:  Moderate Duration:  1 week Timing:  Intermittent Progression:  Worsening Chronicity:  New Relieved by:  Being still Worsened by:  Movement Associated symptoms: headaches   Associated symptoms: no vomiting    Patient presents for multiple complaints.  She reports intermittent episodes of dizziness for the past week, seem to be worse with movement.  No syncope.  She also reports mild headache.  She reports mild chest congestion that makes her feel like she has a sinus infection.  Denies fever/vomiting.  No cough.  Reports at work she is exposed to a lot of dust and paint, she feels this may be contributing to her symptoms.  She also reports lack of sleep as well as lack of p.o. fluids Past Medical History:  Diagnosis Date  . Anxiety   . Asthma   . Depression   . Environmental allergies   . GERD (gastroesophageal reflux disease)   . Mood disorder (HCC)     There are no active problems to display for this patient.   History reviewed. No pertinent surgical history.   OB History   None      Home Medications    Prior to Admission medications   Medication Sig Start Date End Date Taking? Authorizing Provider  EPINEPHrine (EPIPEN 2-PAK IJ) Inject 1 Syringe as directed once as needed. For allergic reaction    [provider]    Family History Family History  Problem Relation Age of Onset  . Hypertension Mother   . Diabetes Other   . Hypertension Other     Social History Social History   Tobacco Use  . Smoking status: Current Every Day Smoker    Packs/day: 0.00    Types: Cigarettes    Last attempt to quit: 03/30/2014    Years since  quitting: 3.4  . Smokeless tobacco: Never Used  Substance Use Topics  . Alcohol use: Yes    Comment: occ  . Drug use: No     Allergies   Patient has no known allergies.   Review of Systems Review of Systems  Constitutional: Positive for fatigue.  Gastrointestinal: Negative for vomiting.  Neurological: Positive for dizziness and headaches. Negative for syncope.  All other systems reviewed and are negative.    Physical Exam Updated Vital Signs BP (!) 126/92   Pulse 77   Temp (!) 97.3 F (36.3 C) (Oral)   Resp (!) 23   LMP 09/06/2017   SpO2 100%   Physical Exam CONSTITUTIONAL: Well developed/well nourished, watching television, well-appearing, smells of marijuana HEAD: Normocephalic/atraumatic EYES: EOMI/PERRL, no nystagmus, no ptosis ENMT: Mucous membranes moist NECK: supple no meningeal signs, no bruits SPINE/BACK:entire spine nontender CV: S1/S2 noted, no murmurs/rubs/gallops noted LUNGS: Lungs are clear to auscultation bilaterally, no apparent distress ABDOMEN: soft, nontender, no rebound or guarding GU:no cva tenderness NEURO:Awake/alert, face symmetric, no arm or leg drift is noted Equal 5/5 strength with shoulder abduction, elbow flex/extension, wrist flex/extension in upper extremities and equal hand grips bilaterally Equal 5/5 strength with hip flexion,knee flex/extension, foot dorsi/plantar flexion Cranial nerves 3/4/5/6/07/25/08/11/12 tested and intact No past pointing Sensation to light touch intact in all extremities EXTREMITIES: pulses  normal, full ROM SKIN: warm, color normal PSYCH: no abnormalities of mood noted, alert and oriented to situation    ED Treatments / Results  Labs (all labs ordered are listed, but only abnormal results are displayed) Labs Reviewed  I-STAT CHEM 8, ED  I-STAT BETA HCG BLOOD, ED ([facility], [facility], [facility] ONLY)    EKG EKG Interpretation  Date/Time:  Friday September 22 2017 00:26:26 EDT Ventricular Rate:  65 PR Interval:      QRS Duration: 90 QT Interval:  400 QTC Calculation: 416 R Axis:   60 Text Interpretation:  Sinus rhythm Borderline Q waves in lateral leads ST elev, probable normal early repol pattern Confirmed by [name] ([id number]) on 09/22/2017 12:50:23 AM   Radiology No results found.  Procedures Procedures (including critical care time)  Medications Ordered in ED Medications - No data to display   Initial Impression / Assessment and Plan / ED Course  I have reviewed the triage vital signs and the nursing notes.  Pertinent labs  results that were available during my care of the patient were reviewed by me and considered in my medical decision making (see chart for details).     Labs unremarkable.  No neuro deficits. She is well-appearing.  EKG  unremarkable Will discharge home Final Clinical Impressions(s) / ED Diagnoses   Final diagnoses:  Dizziness    ED Discharge Orders    None       [name], MD 09/22/17 [phone]

## 2017-11-08 ENCOUNTER — Encounter (HOSPITAL_COMMUNITY): Payer: Self-pay | Admitting: Emergency Medicine

## 2017-11-08 ENCOUNTER — Emergency Department (HOSPITAL_COMMUNITY)
Admission: EM | Admit: 2017-11-08 | Discharge: 2017-11-08 | Disposition: A | Discharge status: Home / Self Care | Payer: Managed Care, Other (non HMO) | Attending: Emergency Medicine | Admitting: Emergency Medicine

## 2017-11-08 ENCOUNTER — Other Ambulatory Visit: Payer: Self-pay

## 2017-11-08 DIAGNOSIS — Z79899 Other long term (current) drug therapy: Secondary | ICD-10-CM | POA: Insufficient documentation

## 2017-11-08 DIAGNOSIS — K0510 Chronic gingivitis, plaque induced: Secondary | ICD-10-CM | POA: Insufficient documentation

## 2017-11-08 DIAGNOSIS — K2100 Gastro-esophageal reflux disease with esophagitis, without bleeding: Secondary | ICD-10-CM

## 2017-11-08 DIAGNOSIS — J45909 Unspecified asthma, uncomplicated: Secondary | ICD-10-CM | POA: Insufficient documentation

## 2017-11-08 DIAGNOSIS — F17210 Nicotine dependence, cigarettes, uncomplicated: Secondary | ICD-10-CM | POA: Insufficient documentation

## 2017-11-08 DIAGNOSIS — R1114 Bilious vomiting: Secondary | ICD-10-CM

## 2017-11-08 LAB — COMPREHENSIVE METABOLIC PANEL
ALT: 13 U/L (ref 0–44)
AST: 16 U/L (ref 15–41)
Albumin: 3.8 g/dL (ref 3.5–5.0)
Alkaline Phosphatase: 53 U/L (ref 38–126)
Anion gap: 6 (ref 5–15)
BUN: 9 mg/dL (ref 6–20)
CHLORIDE: 110 mmol/L (ref 98–111)
CO2: 22 mmol/L (ref 22–32)
CREATININE: 0.73 mg/dL (ref 0.44–1.00)
Calcium: 8.8 mg/dL — ABNORMAL LOW (ref 8.9–10.3)
GFR calc non Af Amer: >60 mL/min (ref >60)
Glucose, Bld: 91 mg/dL (ref 70–99)
Potassium: 3.8 mmol/L (ref 3.5–5.1)
SODIUM: 138 mmol/L (ref 135–145)
Total Bilirubin: 0.4 mg/dL (ref 0.3–1.2)
Total Protein: 7 g/dL (ref 6.5–8.1)

## 2017-11-08 LAB — CBC WITH DIFFERENTIAL/PLATELET
ABS IMMATURE GRANULOCYTES: 0.01 10*3/uL (ref 0.00–0.07)
Basophils Absolute: 0 10*3/uL (ref 0.0–0.1)
Basophils Relative: 0 %
Eosinophils Absolute: 0.4 10*3/uL (ref 0.0–0.5)
Eosinophils Relative: 6 %
HCT: 34.7 % — ABNORMAL LOW (ref 36.0–46.0)
HEMOGLOBIN: 12 g/dL (ref 12.0–15.0)
Immature Granulocytes: 0 %
LYMPHS PCT: 36 %
Lymphs Abs: 2.3 10*3/uL (ref 0.7–4.0)
MCH: 27.7 pg (ref 26.0–34.0)
MCHC: 34.6 g/dL (ref 30.0–36.0)
MCV: 80.1 fL (ref 80.0–100.0)
MONO ABS: 0.8 10*3/uL (ref 0.1–1.0)
Monocytes Relative: 12 %
NRBC: 0 % (ref 0.0–0.2)
Neutro Abs: 2.9 10*3/uL (ref 1.7–7.7)
Neutrophils Relative %: 46 %
Platelets: 369 10*3/uL (ref 150–400)
RBC: 4.33 MIL/uL (ref 3.87–5.11)
RDW: 15.3 % (ref 11.5–15.5)
WBC: 6.3 10*3/uL (ref 4.0–10.5)

## 2017-11-08 LAB — GROUP A STREP BY PCR: GROUP A STREP BY PCR: NOT DETECTED

## 2017-11-08 LAB — LIPASE, BLOOD: Lipase: 36 U/L (ref 11–51)

## 2017-11-08 MED ORDER — PANTOPRAZOLE SODIUM 40 MG IV SOLR
40.0000 mg | Freq: Once | INTRAVENOUS | Status: AC
  Administered 2017-11-08: 40 mg via INTRAVENOUS
  Filled 2017-11-08: qty 40

## 2017-11-08 MED ORDER — CHLORHEXIDINE GLUCONATE 0.12 % MT SOLN: 15.0000 mL | Freq: Two times a day (BID) | OROMUCOSAL | 0 refills | Status: AC

## 2017-11-08 MED ORDER — AMOXICILLIN 500 MG PO CAPS: 500.0000 mg | ORAL_CAPSULE | Freq: Three times a day (TID) | ORAL | 0 refills | Status: DC

## 2017-11-08 MED ORDER — OMEPRAZOLE 20 MG PO CPDR: 20.0000 mg | DELAYED_RELEASE_CAPSULE | Freq: Every day | ORAL | 0 refills | Status: AC

## 2017-11-08 NOTE — ED Triage Notes (Signed)
Pt c/o vomiting and sore throat since Monday. Nad. Lower abd pain. Diarrhea x 3 in last 24 hrs.

## 2017-11-08 NOTE — Discharge Instructions (Signed)
Try to avoid alcohol, greasy and spicy foods.  Bland diet as tolerated.  Follow-up with your dentist for recheck.  Return to ER for any worsening symptoms such as persistent vomiting, increased abdominal pain, and/or fever

## 2017-11-09 NOTE — ED Provider Notes (Signed)
[facility] EMERGENCY DEPARTMENT Provider Note   CSN: [id number] Arrival date & time: 11/08/17  4098     History   Chief Complaint Chief Complaint  Patient presents with  . Emesis    HPI Morgan Villegas is a 22 y.o. female.  HPI   Morgan Villegas is a 22 y.o. female who presents to the Emergency Department complaining of pain and swelling of her lower gums and pain under her tongue.  Symptoms have been present for 2 days.  She also complains of waking with diffuse abdominal pain, nausea, and intermittent vomiting that began this morning.  She also reports some loose stools.  She states the mouth pain is worse with movement of her tongue and associated with pain in her lower teeth.  No bleeding of her gums and she denies trauma although she admits to brushing her teeth very briskly.  She describes the abdominal pain as "all over" but worse in her upper abdomen.  She has a history of GERD and has been off of her medications for some time.  She also admits to eating spicy junk food.  She denies fever, chills, dysuria, vaginal pain, discharge or abnormal bleeding.   Past Medical History:  Diagnosis Date  . Anxiety   . Asthma   . Depression   . Environmental allergies   . GERD (gastroesophageal reflux disease)   . Mood disorder (HCC)     There are no active problems to display for this patient.   History reviewed. No pertinent surgical history.   OB History   None      Home Medications    Prior to Admission medications   Medication Sig Start Date End Date Taking? Authorizing Provider  albuterol (PROVENTIL HFA;VENTOLIN HFA) 108 (90 Base) MCG/ACT inhaler Inhale 1-2 puffs into the lungs every 4 (four) hours as needed. 08/12/13  Yes [provider]  amoxicillin (AMOXIL) 500 MG capsule Take 1 capsule (500 mg total) by mouth 3 (three) times daily. 11/08/17   Jawanza Zambito, PA-C  cetirizine (ZYRTEC) 10 MG tablet Take 1 tablet by mouth daily.    [provider]    chlorhexidine (PERIDEX) 0.12 % solution Use as directed 15 mLs in the mouth or throat 2 (two) times daily. Swish and spit, do not swallow 11/08/17   Conny Moening, PA-C  EPINEPHrine (EPIPEN 2-PAK IJ) Inject 1 Syringe as directed once as needed. For allergic reaction    [provider]  omeprazole (PRILOSEC) 20 MG capsule Take 1 capsule (20 mg total) by mouth daily. 11/08/17   Pauline Aus, PA-C    Family History Family History  Problem Relation Age of Onset  . Hypertension Mother   . Diabetes Other   . Hypertension Other     Social History Social History   Tobacco Use  . Smoking status: Current Every Day Smoker    Packs/day: 0.00    Types: Cigarettes    Last attempt to quit: 03/30/2014    Years since quitting: 3.6  . Smokeless tobacco: Never Used  Substance Use Topics  . Alcohol use: Yes    Comment: occ  . Drug use: No     Allergies   Patient has no known allergies.   Review of Systems Review of Systems  Constitutional: Negative for appetite change, chills and fever.  HENT: Positive for dental problem. Negative for trouble swallowing.   Respiratory: Negative for shortness of breath.   Cardiovascular: Negative for chest pain.  Gastrointestinal: Positive for abdominal pain, diarrhea, nausea  and vomiting. Negative for abdominal distention and blood in stool.  Genitourinary: Negative for difficulty urinating, dysuria, flank pain, vaginal bleeding and vaginal discharge.  Musculoskeletal: Negative for back pain.  Skin: Negative for color change and rash.  Neurological: Negative for dizziness, weakness and numbness.  Hematological: Negative for adenopathy.     Physical Exam Updated Vital Signs BP 98/70   Pulse 60   Temp 98.2 F (36.8 C)   Resp 18   LMP 11/05/2017   SpO2 99%   Physical Exam  Constitutional: She is oriented to person, place, and time. She appears well-developed and well-nourished. No distress.  HENT:  Head: Normocephalic and  atraumatic.  Right Ear: Tympanic membrane and ear canal normal.  Left Ear: Tympanic membrane and ear canal normal.  Mouth/Throat: Uvula is midline, oropharynx is clear and moist and mucous membranes are normal. No trismus in the jaw. Dental caries present. No dental abscesses or uvula swelling.  ttp of the lower central and lateral incisors. Mild erythema of the surrounding gingiva.  No facial swelling, obvious dental abscess, trismus, or sublingual abnml.    Neck: Normal range of motion. Neck supple. No tracheal deviation present.  Cardiovascular: Normal rate, regular rhythm and normal heart sounds.  No murmur heard. Pulmonary/Chest: Effort normal and breath sounds normal.  Abdominal: Soft. Normal appearance. She exhibits no distension and no mass. There is generalized tenderness. There is no rigidity, no rebound, no guarding, no CVA tenderness and no tenderness at McBurney's point.  Musculoskeletal: Normal range of motion.  Lymphadenopathy:       Head (right side): No submental and no submandibular adenopathy present.       Head (left side): No submental and no submandibular adenopathy present.    She has no cervical adenopathy.  Neurological: She is alert and oriented to person, place, and time. She exhibits normal muscle tone. Coordination normal.  Skin: Skin is warm and dry. Capillary refill takes less than 2 seconds.  Psychiatric: She has a normal mood and affect.  Nursing note and vitals reviewed.    ED Treatments / Results  Labs (all labs ordered are listed, but only abnormal results are displayed) Labs Reviewed  COMPREHENSIVE METABOLIC PANEL - Abnormal; Notable for the following components:      Result Value   Calcium 8.8 (*)    All other components within normal limits  CBC WITH DIFFERENTIAL/PLATELET - Abnormal; Notable for the following components:   HCT 34.7 (*)    All other components within normal limits  GROUP A STREP BY PCR  LIPASE, BLOOD     EKG None  Radiology No results found.  Procedures Procedures (including critical care time)  Medications Ordered in ED Medications  pantoprazole (PROTONIX) injection 40 mg (40 mg Intravenous Given 11/08/17 1027)     Initial Impression / Assessment and Plan / ED Course  I have reviewed the triage vital signs and the nursing notes.  Pertinent labs & imaging results that were available during my care of the patient were reviewed by me and considered in my medical decision making (see chart for details).     Pt well appearing.  VSS.  Mild, diffuse abdominal pain w/o guarding or peritoneal signs.   On recheck, pt sleeping, easily aroused.  Abdominal pain resolved after IV protonix.  Mouth pain felt to be associated with gingivitis.  No sublingual abnml on exam.  No submental lymphadenopathy.  Pt agrees to tx plan, d/c home and return precautions discussed.   Final Clinical Impressions(s) /  ED Diagnoses   Final diagnoses:  Bilious vomiting with nausea  Gingivitis  Gastroesophageal reflux disease with esophagitis    ED Discharge Orders         Ordered    chlorhexidine (PERIDEX) 0.12 % solution  2 times daily     11/08/17 1356    amoxicillin (AMOXIL) 500 MG capsule  3 times daily     11/08/17 1356    omeprazole (PRILOSEC) 20 MG capsule  Daily     11/08/17 1356           Pauline Aus, PA-C 11/09/17 0948    Raeford Razor, MD 11/09/17 1242

## 2017-11-23 ENCOUNTER — Emergency Department (HOSPITAL_COMMUNITY): Payer: Medicaid Other

## 2017-11-23 ENCOUNTER — Other Ambulatory Visit: Payer: Self-pay

## 2017-11-23 ENCOUNTER — Encounter (HOSPITAL_COMMUNITY): Payer: Self-pay | Admitting: Emergency Medicine

## 2017-11-23 ENCOUNTER — Emergency Department (HOSPITAL_COMMUNITY)
Admission: EM | Admit: 2017-11-23 | Discharge: 2017-11-23 | Disposition: A | Discharge status: Home / Self Care | Payer: Medicaid Other | Attending: Emergency Medicine | Admitting: Emergency Medicine

## 2017-11-23 DIAGNOSIS — J209 Acute bronchitis, unspecified: Secondary | ICD-10-CM | POA: Insufficient documentation

## 2017-11-23 DIAGNOSIS — Z79899 Other long term (current) drug therapy: Secondary | ICD-10-CM | POA: Insufficient documentation

## 2017-11-23 DIAGNOSIS — Z720 Tobacco use: Secondary | ICD-10-CM

## 2017-11-23 DIAGNOSIS — J4521 Mild intermittent asthma with (acute) exacerbation: Secondary | ICD-10-CM

## 2017-11-23 DIAGNOSIS — F17210 Nicotine dependence, cigarettes, uncomplicated: Secondary | ICD-10-CM | POA: Insufficient documentation

## 2017-11-23 MED ORDER — KETOROLAC TROMETHAMINE 30 MG/ML IJ SOLN
30.0000 mg | Freq: Once | INTRAMUSCULAR | Status: AC
  Administered 2017-11-23: 30 mg via INTRAMUSCULAR
  Filled 2017-11-23: qty 1

## 2017-11-23 MED ORDER — AZITHROMYCIN 250 MG PO TABS: ORAL_TABLET | ORAL | 0 refills | Status: DC

## 2017-11-23 MED ORDER — ALBUTEROL SULFATE HFA 108 (90 BASE) MCG/ACT IN AERS
1.0000 | INHALATION_SPRAY | RESPIRATORY_TRACT | Status: DC | PRN
  Administered 2017-11-23: 2 via RESPIRATORY_TRACT
  Filled 2017-11-23: qty 6.7

## 2017-11-23 MED ORDER — IPRATROPIUM-ALBUTEROL 0.5-2.5 (3) MG/3ML IN SOLN
3.0000 mL | Freq: Once | RESPIRATORY_TRACT | Status: AC
  Administered 2017-11-23: 3 mL via RESPIRATORY_TRACT
  Filled 2017-11-23: qty 3

## 2017-11-23 MED ORDER — AZITHROMYCIN 250 MG PO TABS
500.0000 mg | ORAL_TABLET | Freq: Once | ORAL | Status: AC
  Administered 2017-11-23: 500 mg via ORAL
  Filled 2017-11-23: qty 2

## 2017-11-23 MED ORDER — AEROCHAMBER PLUS FLO-VU MEDIUM MISC
1.0000 | Freq: Once | Status: AC
  Administered 2017-11-23: 1
  Filled 2017-11-23: qty 1

## 2017-11-23 NOTE — Discharge Instructions (Signed)
Try to stop smoking. °

## 2017-11-23 NOTE — ED Triage Notes (Signed)
Pt states she has seasonal allergies. She states each time the seasons change she gets a hoarse voice with coughing, sneezing and congestion x 1 week, no relief with OTC meds, productive cough that is thick and green

## 2017-11-23 NOTE — ED Provider Notes (Signed)
[facility] EMERGENCY DEPARTMENT Provider Note   CSN: [id number] Arrival date & time: 11/23/17  2135     History   Chief Complaint Chief Complaint  Patient presents with  . Shortness of Breath    HPI Morgan Villegas is a 22 y.o. female.  Pt presents to the ED today with cough and sinus congestion.  She said sx have been going on for 1 week.  She has tried several otc cold meds without relief of sx.  She is coughing up thick green phlegm.  No fever.  + wheezing.     Past Medical History:  Diagnosis Date  . Anxiety   . Asthma   . Depression   . Environmental allergies   . GERD (gastroesophageal reflux disease)   . Mood disorder (HCC)     There are no active problems to display for this patient.   History reviewed. No pertinent surgical history.   OB History   None      Home Medications    Prior to Admission medications   Medication Sig Start Date End Date Taking? Authorizing Provider  EPINEPHrine (EPIPEN 2-PAK IJ) Inject 1 Syringe as directed once as needed. For allergic reaction   Yes [provider]  Pseudoeph-CPM-DM-APAP (ALKA-SELTZER PLUS-D SINUS/COLD) 30-2-10-325 MG CAPS Take 1-2 capsules by mouth daily as needed (Day/Night Formula).   Yes [provider]  albuterol (PROVENTIL HFA;VENTOLIN HFA) 108 (90 Base) MCG/ACT inhaler Inhale 1-2 puffs into the lungs every 4 (four) hours as needed. 08/12/13   [provider]  amoxicillin (AMOXIL) 500 MG capsule Take 1 capsule (500 mg total) by mouth 3 (three) times daily. Patient not taking: Reported on 11/23/2017 11/08/17   Triplett, Tammy, PA-C  azithromycin (ZITHROMAX) 250 MG tablet Take 1 every day until finished. 11/24/17   Jacalyn Lefevre, MD  chlorhexidine (PERIDEX) 0.12 % solution Use as directed 15 mLs in the mouth or throat 2 (two) times daily. Swish and spit, do not swallow Patient not taking: Reported on 11/23/2017 11/08/17   Triplett, Tammy, PA-C  omeprazole (PRILOSEC) 20 MG capsule  Take 1 capsule (20 mg total) by mouth daily. Patient not taking: Reported on 11/23/2017 11/08/17   Pauline Aus, PA-C    Family History Family History  Problem Relation Age of Onset  . Hypertension Mother   . Diabetes Other   . Hypertension Other     Social History Social History   Tobacco Use  . Smoking status: Current Every Day Smoker    Packs/day: 0.00    Types: Cigarettes    Last attempt to quit: 03/30/2014    Years since quitting: 3.6  . Smokeless tobacco: Never Used  Substance Use Topics  . Alcohol use: Yes    Comment: occ  . Drug use: No     Allergies   Patient has no known allergies.   Review of Systems Review of Systems  Respiratory: Positive for cough and wheezing.   All other systems reviewed and are negative.    Physical Exam Updated Vital Signs BP 131/88 (BP Location: Right Arm)   Pulse 92   Temp 98 F (36.7 C) (Oral)   Resp 18   Ht 5\' 5"  (1.651 m)   Wt 70.3 kg   LMP 11/05/2017 (LMP Unknown)   SpO2 100%   BMI 25.79 kg/m   Physical Exam  Constitutional: She is oriented to person, place, and time. She appears well-developed and well-nourished.  HENT:  Head: Normocephalic and atraumatic.  Mouth/Throat: Oropharynx is clear and  moist.  Eyes: Pupils are equal, round, and reactive to light. EOM are normal.  Neck: Normal range of motion. Neck supple.  Cardiovascular: Normal rate and regular rhythm.  Pulmonary/Chest: Effort normal. She has wheezes.  Abdominal: Soft. Bowel sounds are normal.  Musculoskeletal: Normal range of motion.       Right lower leg: Normal.       Left lower leg: Normal.  Neurological: She is alert and oriented to person, place, and time.  Skin: Skin is warm and dry. Capillary refill takes less than 2 seconds.  Psychiatric: She has a normal mood and affect. Her behavior is normal.  Nursing note and vitals reviewed.    ED Treatments / Results  Labs (all labs ordered are listed, but only abnormal results are  displayed) Labs Reviewed - No data to display  EKG None  Radiology Dg Chest 2 View  Result Date: 11/23/2017 CLINICAL DATA:  Cough. EXAM: CHEST - 2 VIEW COMPARISON:  None. FINDINGS: The heart size and mediastinal contours are within normal limits. Both lungs are clear. The visualized skeletal structures are unremarkable. IMPRESSION: Negative two view chest x-ray Electronically Signed   By: Marin Roberts M.D.   On: 11/23/2017 22:37    Procedures Procedures (including critical care time)  Medications Ordered in ED Medications  albuterol (PROVENTIL HFA;VENTOLIN HFA) 108 (90 Base) MCG/ACT inhaler 1-2 puff (has no administration in time range)  AEROCHAMBER PLUS FLO-VU MEDIUM MISC 1 each (has no administration in time range)  azithromycin (ZITHROMAX) tablet 500 mg (has no administration in time range)  ipratropium-albuterol (DUONEB) 0.5-2.5 (3) MG/3ML nebulizer solution 3 mL (3 mLs Nebulization Given 11/23/17 2230)  ketorolac (TORADOL) 30 MG/ML injection 30 mg (30 mg Intramuscular Given 11/23/17 2206)     Initial Impression / Assessment and Plan / ED Course  I have reviewed the triage vital signs and the nursing notes.  Pertinent labs & imaging results that were available during my care of the patient were reviewed by me and considered in my medical decision making (see chart for details).     Pt is feeling better after neb.  The pt will be given an albuterol inhaler and spacer for home.  She is encouraged to stop smoking.  She is will be started on abx as sx have been going on for 1 week.  Final Clinical Impressions(s) / ED Diagnoses   Final diagnoses:  Acute bronchitis, unspecified organism  Tobacco abuse  Mild intermittent reactive airway disease with acute exacerbation    ED Discharge Orders         Ordered    azithromycin (ZITHROMAX) 250 MG tablet     11/23/17 2251           Jacalyn Lefevre, MD 11/23/17 2257

## 2018-01-18 ENCOUNTER — Encounter (HOSPITAL_COMMUNITY): Payer: Self-pay | Admitting: Emergency Medicine

## 2018-01-18 ENCOUNTER — Other Ambulatory Visit: Payer: Self-pay

## 2018-01-18 ENCOUNTER — Emergency Department (HOSPITAL_COMMUNITY)
Admission: EM | Admit: 2018-01-18 | Discharge: 2018-01-18 | Disposition: A | Discharge status: Home / Self Care | Payer: Medicaid Other | Attending: Emergency Medicine | Admitting: Emergency Medicine

## 2018-01-18 DIAGNOSIS — J45909 Unspecified asthma, uncomplicated: Secondary | ICD-10-CM | POA: Insufficient documentation

## 2018-01-18 DIAGNOSIS — F17210 Nicotine dependence, cigarettes, uncomplicated: Secondary | ICD-10-CM | POA: Insufficient documentation

## 2018-01-18 DIAGNOSIS — R251 Tremor, unspecified: Secondary | ICD-10-CM

## 2018-01-18 LAB — CBC WITH DIFFERENTIAL/PLATELET
Abs Immature Granulocytes: 0.01 10*3/uL (ref 0.00–0.07)
Basophils Absolute: 0 10*3/uL (ref 0.0–0.1)
Basophils Relative: 0 %
EOS PCT: 10 %
Eosinophils Absolute: 0.6 10*3/uL — ABNORMAL HIGH (ref 0.0–0.5)
HCT: 32.3 % — ABNORMAL LOW (ref 36.0–46.0)
HEMOGLOBIN: 11.3 g/dL — AB (ref 12.0–15.0)
Immature Granulocytes: 0 %
LYMPHS PCT: 38 %
Lymphs Abs: 2.4 10*3/uL (ref 0.7–4.0)
MCH: 28.8 pg (ref 26.0–34.0)
MCHC: 35 g/dL (ref 30.0–36.0)
MCV: 82.2 fL (ref 80.0–100.0)
MONO ABS: 0.5 10*3/uL (ref 0.1–1.0)
Monocytes Relative: 8 %
Neutro Abs: 2.8 10*3/uL (ref 1.7–7.7)
Neutrophils Relative %: 44 %
Platelets: 312 10*3/uL (ref 150–400)
RBC: 3.93 MIL/uL (ref 3.87–5.11)
RDW: 14.9 % (ref 11.5–15.5)
WBC: 6.3 10*3/uL (ref 4.0–10.5)
nRBC: 0 % (ref 0.0–0.2)

## 2018-01-18 LAB — BASIC METABOLIC PANEL
Anion gap: 5 (ref 5–15)
BUN: 10 mg/dL (ref 6–20)
CHLORIDE: 115 mmol/L — AB (ref 98–111)
CO2: 21 mmol/L — AB (ref 22–32)
CREATININE: 0.82 mg/dL (ref 0.44–1.00)
Calcium: 8.8 mg/dL — ABNORMAL LOW (ref 8.9–10.3)
GFR calc Af Amer: >60 mL/min (ref >60)
GFR calc non Af Amer: >60 mL/min (ref >60)
GLUCOSE: 91 mg/dL (ref 70–99)
Potassium: 3.6 mmol/L (ref 3.5–5.1)
SODIUM: 141 mmol/L (ref 135–145)

## 2018-01-18 LAB — CBG MONITORING, ED: GLUCOSE-CAPILLARY: 82 mg/dL (ref 70–99)

## 2018-01-18 NOTE — ED Notes (Signed)
Patient reports recent episodes of waking with shaking, weakness, and headache. During discussion with the patient she expressed she has been under a great deal of stress and feels like it is affecting her health. Patient does not have tremors or headache at present.

## 2018-01-18 NOTE — ED Provider Notes (Signed)
[facility] EMERGENCY DEPARTMENT Provider Note   CSN: [id number] Arrival date & time: 01/18/18  1211     History   Chief Complaint Chief Complaint  Patient presents with  . Tremors    HPI Morgan Villegas is a 23 y.o. female who presents today for evlaution of skaing in the morning.  This goes away after she eats.  She reports that this started 2 weeks ago.  She gets a headache when this happened.  She reports that this happens every single morning.  She says that this also happens when she gets excited.    She stated that she has been taking lexapro to help with her mood, anxiety and other mental health conditions.  She is concerned that she may have low blood sugar in the mornings.    She currently denies any symptoms.  No current tremor, headache, or feeling poorly.   HPI  Past Medical History:  Diagnosis Date  . Anxiety   . Asthma   . Depression   . Environmental allergies   . GERD (gastroesophageal reflux disease)   . Mood disorder (HCC)     There are no active problems to display for this patient.   History reviewed. No pertinent surgical history.   OB History   No obstetric history on file.      Home Medications    Prior to Admission medications   Medication Sig Start Date End Date Taking? Authorizing Provider  albuterol (PROVENTIL HFA;VENTOLIN HFA) 108 (90 Base) MCG/ACT inhaler Inhale 1-2 puffs into the lungs every 4 (four) hours as needed. 08/12/13   [provider]  amoxicillin (AMOXIL) 500 MG capsule Take 1 capsule (500 mg total) by mouth 3 (three) times daily. Patient not taking: Reported on 11/23/2017 11/08/17   Triplett, Tammy, PA-C  azithromycin (ZITHROMAX) 250 MG tablet Take 1 every day until finished. 11/24/17   Jacalyn Lefevre, MD  chlorhexidine (PERIDEX) 0.12 % solution Use as directed 15 mLs in the mouth or throat 2 (two) times daily. Swish and spit, do not swallow Patient not taking: Reported on 11/23/2017 11/08/17   Triplett, Tammy, PA-C    EPINEPHrine (EPIPEN 2-PAK IJ) Inject 1 Syringe as directed once as needed. For allergic reaction    [provider]  omeprazole (PRILOSEC) 20 MG capsule Take 1 capsule (20 mg total) by mouth daily. Patient not taking: Reported on 11/23/2017 11/08/17   Triplett, Tammy, PA-C  Pseudoeph-CPM-DM-APAP (ALKA-SELTZER PLUS-D SINUS/COLD) 30-2-10-325 MG CAPS Take 1-2 capsules by mouth daily as needed (Day/Night Formula).    [provider]    Family History Family History  Problem Relation Age of Onset  . Hypertension Mother   . Diabetes Other   . Hypertension Other     Social History Social History   Tobacco Use  . Smoking status: Current Every Day Smoker    Packs/day: 0.00    Types: Cigarettes    Last attempt to quit: 03/30/2014    Years since quitting: 3.8  . Smokeless tobacco: Never Used  Substance Use Topics  . Alcohol use: Yes    Comment: occ  . Drug use: Yes    Types: Marijuana     Allergies   Patient has no known allergies.   Review of Systems Review of Systems  Constitutional: Negative for chills and fever.  Respiratory: Negative for shortness of breath.   Cardiovascular: Negative for chest pain.  Neurological: Negative for weakness and headaches.       Shaking in the morning before she  eats.   All other systems reviewed and are negative.    Physical Exam Updated Vital Signs BP 111/71 (BP Location: Right Arm)   Pulse (!) 58   Temp 98.1 F (36.7 C) (Oral)   Resp 16   Ht 5\' 3"  (1.6 m)   Wt 68.5 kg   LMP 01/16/2018   SpO2 100%   BMI 26.75 kg/m   Physical Exam Vitals signs and nursing note reviewed.  Constitutional:      General: She is not in acute distress.    Appearance: She is not ill-appearing.  HENT:     Head: Normocephalic.  Eyes:     Conjunctiva/sclera: Conjunctivae normal.  Cardiovascular:     Rate and Rhythm: Normal rate.  Pulmonary:     Effort: Pulmonary effort is normal. No respiratory distress.  Neurological:      General: No focal deficit present.     Mental Status: She is alert.     Comments: Awake and alert.  No tremor at this time.   Psychiatric:        Behavior: Behavior normal.      ED Treatments / Results  Labs (all labs ordered are listed, but only abnormal results are displayed) Labs Reviewed  CBC WITH DIFFERENTIAL/PLATELET - Abnormal; Notable for the following components:      Result Value   Hemoglobin 11.3 (*)    HCT 32.3 (*)    Eosinophils Absolute 0.6 (*)    All other components within normal limits  BASIC METABOLIC PANEL - Abnormal; Notable for the following components:   Chloride 115 (*)    CO2 21 (*)    Calcium 8.8 (*)    All other components within normal limits  CBG MONITORING, ED    EKG None  Radiology No results found.  Procedures Procedures (including critical care time)  Medications Ordered in ED Medications - No data to display   Initial Impression / Assessment and Plan / ED Course  I have reviewed the triage vital signs and the nursing notes.  Pertinent labs & imaging results that were available during my care of the patient were reviewed by me and considered in my medical decision making (see chart for details).     Morgan Villegas presents today for for evaluation of generally not feeling well in the mornings.  She reports that in the morning she wakes up shaky and feeling poorly.  This resolves after she eats.  Labs here show a normal blood sugar.  She does not have significant electrolyte or hematologic abnormalities.  Mild anemia which patient says is normal for her.  Recommended obtaining a glucometer.  Offered to provide her with a prescription however she declined stating she does not have insurance currently.  Instructed her to provide a diary of her symptoms, what her blood sugar is and how she is feeling and obtain primary care follow-up.  Of note, she is not having any symptoms at the time of her ED visit.   Return precautions were discussed  with patient who states their understanding.  At the time of discharge patient denied any unaddressed complaints or concerns.  Patient is agreeable for discharge home.   Final Clinical Impressions(s) / ED Diagnoses   Final diagnoses:  Shaking    ED Discharge Orders    None       Norman ClayHammond, Xavier Munger W, PA-C 01/18/18 1709    Doug SouJacubowitz, Sam, MD 01/19/18 [phone]

## 2018-01-18 NOTE — Discharge Instructions (Addendum)
Please go to the drugstore and get blood sugar testing supplies.  You will need a meter, test strips, and a lancet to poke your finger.  The lancet often comes with the meter.  Please start making a diary of your symptoms and what your blood sugar is.  If your blood sugar is below 70 then please drink some juice with sugar in it, approximately 1 cup, and recheck in 30 minutes.

## 2018-01-18 NOTE — ED Triage Notes (Signed)
Patient states "for the last two weeks, when I don't eat I get really shaky and feel like I'm going to pass out until I eat something. It also happens when I get real upset but it goes away if I calm down or eat something."

## 2018-07-18 ENCOUNTER — Other Ambulatory Visit: Payer: Self-pay

## 2018-07-18 ENCOUNTER — Emergency Department (HOSPITAL_COMMUNITY): Payer: HRSA Program

## 2018-07-18 ENCOUNTER — Emergency Department (HOSPITAL_COMMUNITY)
Admission: EM | Admit: 2018-07-18 | Discharge: 2018-07-18 | Disposition: A | Discharge status: Home / Self Care | Payer: HRSA Program | Attending: Emergency Medicine | Admitting: Emergency Medicine

## 2018-07-18 ENCOUNTER — Encounter (HOSPITAL_COMMUNITY): Payer: Self-pay | Admitting: Emergency Medicine

## 2018-07-18 DIAGNOSIS — Z20828 Contact with and (suspected) exposure to other viral communicable diseases: Secondary | ICD-10-CM | POA: Diagnosis not present

## 2018-07-18 DIAGNOSIS — J45909 Unspecified asthma, uncomplicated: Secondary | ICD-10-CM | POA: Diagnosis not present

## 2018-07-18 DIAGNOSIS — F17210 Nicotine dependence, cigarettes, uncomplicated: Secondary | ICD-10-CM | POA: Insufficient documentation

## 2018-07-18 DIAGNOSIS — R5381 Other malaise: Secondary | ICD-10-CM | POA: Diagnosis not present

## 2018-07-18 DIAGNOSIS — M7918 Myalgia, other site: Secondary | ICD-10-CM | POA: Diagnosis present

## 2018-07-18 LAB — BASIC METABOLIC PANEL
Anion gap: 7 (ref 5–15)
BUN: 10 mg/dL (ref 6–20)
CO2: 22 mmol/L (ref 22–32)
Calcium: 9.1 mg/dL (ref 8.9–10.3)
Chloride: 110 mmol/L (ref 98–111)
Creatinine, Ser: 0.87 mg/dL (ref 0.44–1.00)
GFR calc Af Amer: >60 mL/min (ref >60)
GFR calc non Af Amer: >60 mL/min (ref >60)
Glucose, Bld: 80 mg/dL (ref 70–99)
Potassium: 3.8 mmol/L (ref 3.5–5.1)
Sodium: 139 mmol/L (ref 135–145)

## 2018-07-18 LAB — CBC WITH DIFFERENTIAL/PLATELET
Abs Immature Granulocytes: 0.02 10*3/uL (ref 0.00–0.07)
Basophils Absolute: 0 10*3/uL (ref 0.0–0.1)
Basophils Relative: 0 %
Eosinophils Absolute: 0.5 10*3/uL (ref 0.0–0.5)
Eosinophils Relative: 5 %
HCT: 34.5 % — ABNORMAL LOW (ref 36.0–46.0)
Hemoglobin: 12.1 g/dL (ref 12.0–15.0)
Immature Granulocytes: 0 %
Lymphocytes Relative: 48 %
Lymphs Abs: 5 10*3/uL — ABNORMAL HIGH (ref 0.7–4.0)
MCH: 28.4 pg (ref 26.0–34.0)
MCHC: 35.1 g/dL (ref 30.0–36.0)
MCV: 81 fL (ref 80.0–100.0)
Monocytes Absolute: 0.6 10*3/uL (ref 0.1–1.0)
Monocytes Relative: 6 %
Neutro Abs: 4.2 10*3/uL (ref 1.7–7.7)
Neutrophils Relative %: 41 %
Platelets: 383 10*3/uL (ref 150–400)
RBC: 4.26 MIL/uL (ref 3.87–5.11)
RDW: 15.5 % (ref 11.5–15.5)
WBC: 10.4 10*3/uL (ref 4.0–10.5)
nRBC: 0 % (ref 0.0–0.2)

## 2018-07-18 MED ORDER — SODIUM CHLORIDE 0.9 % IV BOLUS
1000.0000 mL | Freq: Once | INTRAVENOUS | Status: AC
  Administered 2018-07-18: 04:00:00 1000 mL via INTRAVENOUS

## 2018-07-18 MED ORDER — ALBUTEROL SULFATE HFA 108 (90 BASE) MCG/ACT IN AERS
2.0000 | INHALATION_SPRAY | Freq: Once | RESPIRATORY_TRACT | Status: AC
  Administered 2018-07-18: 2 via RESPIRATORY_TRACT
  Filled 2018-07-18: qty 6.7

## 2018-07-18 NOTE — ED Provider Notes (Signed)
[facility]-EMERGENCY DEPT Provider Note   CSN: [id number] Arrival date & time: 07/18/18  0022     History   Chief Complaint Chief Complaint  Patient presents with  . Generalized Body Aches    HPI Morgan Villegas is a 23 y.o. female.     Patient to ED with complaint of generalized body aches, cough, chills. He states that he has had pneumonia in the past and remembers similar symptoms at that time. No vomiting or diarrhea. He denies significant nasal congestion or sore throat. He has been working, using masks and social distancing but is afraid of COVID because of his symptoms. No rash, urinary symptoms.   The history is provided by the patient. No language interpreter was used.    Past Medical History:  Diagnosis Date  . Anxiety   . Asthma   . Depression   . Environmental allergies   . GERD (gastroesophageal reflux disease)   . Mood disorder (HCC)     There are no active problems to display for this patient.   History reviewed. No pertinent surgical history.   OB History   No obstetric history on file.      Home Medications    Prior to Admission medications   Medication Sig Start Date End Date Taking? Authorizing Provider  albuterol (PROVENTIL HFA;VENTOLIN HFA) 108 (90 Base) MCG/ACT inhaler Inhale 1-2 puffs into the lungs every 4 (four) hours as needed. 08/12/13   [provider]  amoxicillin (AMOXIL) 500 MG capsule Take 1 capsule (500 mg total) by mouth 3 (three) times daily. Patient not taking: Reported on 11/23/2017 11/08/17   Triplett, Tammy, PA-C  azithromycin (ZITHROMAX) 250 MG tablet Take 1 every day until finished. 11/24/17   Jacalyn LefevreHaviland, Julie, MD  chlorhexidine (PERIDEX) 0.12 % solution Use as directed 15 mLs in the mouth or throat 2 (two) times daily. Swish and spit, do not swallow Patient not taking: Reported on 11/23/2017 11/08/17   Triplett, Tammy, PA-C  EPINEPHrine (EPIPEN 2-PAK IJ) Inject 1 Syringe as directed once as needed.  For allergic reaction    [provider]  omeprazole (PRILOSEC) 20 MG capsule Take 1 capsule (20 mg total) by mouth daily. Patient not taking: Reported on 11/23/2017 11/08/17   Triplett, Tammy, PA-C  Pseudoeph-CPM-DM-APAP (ALKA-SELTZER PLUS-D SINUS/COLD) 30-2-10-325 MG CAPS Take 1-2 capsules by mouth daily as needed (Day/Night Formula).    [provider]    Family History Family History  Problem Relation Age of Onset  . Hypertension Mother   . Diabetes Other   . Hypertension Other     Social History Social History   Tobacco Use  . Smoking status: Current Every Day Smoker    Packs/day: 0.00    Types: Cigarettes    Last attempt to quit: 03/30/2014    Years since quitting: 4.3  . Smokeless tobacco: Never Used  Substance Use Topics  . Alcohol use: Yes    Comment: occ  . Drug use: Yes    Types: Marijuana     Allergies   Patient has no known allergies.   Review of Systems Review of Systems  Constitutional: Positive for chills. Negative for fever.  HENT: Negative.  Negative for congestion and sore throat.   Respiratory: Positive for cough. Negative for shortness of breath.   Cardiovascular: Negative for chest pain.  Gastrointestinal: Negative for abdominal pain, nausea and vomiting.  Musculoskeletal: Positive for myalgias.  Skin: Positive for rash.  Neurological: Negative for weakness.     Physical  Exam Updated Vital Signs BP 105/68   Pulse 60   Temp 98.6 F (37 C) (Oral)   Resp 20   Ht 5\' 3"  (1.6 m)   Wt 72.6 kg   SpO2 99%   BMI 28.34 kg/m   Physical Exam Constitutional:      Appearance: She is well-developed.  HENT:     Head: Normocephalic.     Mouth/Throat:     Mouth: Mucous membranes are moist.  Neck:     Musculoskeletal: Normal range of motion and neck supple.  Cardiovascular:     Rate and Rhythm: Normal rate and regular rhythm.     Heart sounds: No murmur.  Pulmonary:     Effort: Pulmonary effort is normal.     Breath sounds:  Normal breath sounds. No wheezing, rhonchi or rales.  Abdominal:     General: Bowel sounds are normal.     Palpations: Abdomen is soft.     Tenderness: There is no abdominal tenderness. There is no guarding or rebound.  Musculoskeletal: Normal range of motion.  Skin:    General: Skin is warm and dry.     Findings: No rash.  Neurological:     Mental Status: She is alert and oriented to person, place, and time.      ED Treatments / Results  Labs (all labs ordered are listed, but only abnormal results are displayed) Labs Reviewed - No data to display  EKG None  Radiology No results found.  Procedures Procedures (including critical care time)  Medications Ordered in ED Medications - No data to display   Initial Impression / Assessment and Plan / ED Course  I have reviewed the triage vital signs and the nursing notes.  Pertinent labs & imaging results that were available during my care of the patient were reviewed by me and considered in my medical decision making (see chart for details).        Patient to ED with general malaise, cough, concerned for pneumonia as well as COVID infection.   He is overall well appearing. IVF's provided with some relief. He is eating and drinking. CXR and labs reassuring. COVID test collected for send out and patient informed of MyChart access to obtain results.   He is stable for discharge home with supportive care.    Final Clinical Impressions(s) / ED Diagnoses   Final diagnoses:  None   1. [facility]  ED Discharge Orders    None       Charlann Lange, PA-C 07/20/18 [place], [place], MD 07/24/18 [phone]

## 2018-07-18 NOTE — ED Triage Notes (Signed)
PT reports having body aches for the last week and concerned for wheezing and possible pneumonia. Pt had similar symptoms with pneumonia.

## 2018-07-18 NOTE — ED Notes (Signed)
Pt said, "I want something to snack on. I'm about to go home. My backs about to break off." I encouraged the pt to stay and a provider would be with him asap.

## 2018-07-18 NOTE — ED Notes (Signed)
Pt reports low to mid back, head, and chest pain that started a week ago. Said the last time he felt like this, he had pneumonia.

## 2018-07-18 NOTE — Discharge Instructions (Addendum)
See your doctor if symptoms worsen. Push fluids. Take Tylenol and/or ibuprofen for symptoms of muscle aches and if any fever develops.   Until your test for COVID comes back, follow quarantine instructions as if it were positive. If it returns as negative, you can return to social distancing. If positive, inform your employer and you will need to quarantine for a full 14 days.

## 2018-07-19 LAB — NOVEL CORONAVIRUS, NAA (HOSP ORDER, SEND-OUT TO REF LAB; TAT 18-24 HRS): SARS-CoV-2, NAA: NOT DETECTED

## 2018-09-14 ENCOUNTER — Ambulatory Visit: Payer: Self-pay | Admitting: *Deleted

## 2018-09-14 NOTE — Telephone Encounter (Signed)
Summary: advice in regards to left hand pain/tingle    Patient called in stating when they were an infant they had a pinched never in the left arm due to having the umbilical cord wrapped around arm. Pt stated mother had advised them that there would be no pain or issues later on due to being explained everything by the doctor. Patient is now starting to experience pain and slight tingling in left hand on and off for 2 weeks. Patient is growing concerned and does not want to go to the ED or urgent care. Patient stated there are no other symptoms, just want advice on what to do and if this is something to worry about. Please advise. Call back is [phone].

## 2018-09-14 NOTE — Telephone Encounter (Signed)
Incoming call from Patient who states that she has been expericing  numbness and tingling  In left arm.   Patient states its due to a pinched nerve while her Mother was caring her.  Want' s to know if there is any thing she could do  Recommended tylenol or Ibuprofen  Advised Patient that eventually she would have to make a Dr. Appointment with a Dr. To be evaluated.   Patient voiced Understanding.               Reason for Disposition . Weakness (i.e., loss of strength) in hand or fingers     (Exception: not truly weak; hand feels weak because of pain)  Answer Assessment - Initial Assessment Questions 1. ONSET: "When did the pain start?"             1.5 ago.   2. LOCATION: "Where is the pain located?"     Left  Arm  3. PAIN: "How bad is the pain?" (Scale 1-10; or mild, moderate, severe)   - MILD (1-3): doesn't interfere with normal activities   - MODERATE (4-7): interferes with normal activities (e.g., work or school) or awakens from sleep   - SEVERE (8-10): excruciating pain, unable to do any normal activities, unable to hold a cup of water     Severe.   4. WORK OR EXERCISE: "Has there been any recent work or exercise that involved this part of the body?"    Denies 5. CAUSE: "What do you think is causing the arm pain?"     pincedd nerve 6. OTHER SYMPTOMS: "Do you have any other symptoms?" (e.g., neck pain, swelling, rash, fever, numbness, weakness)     Numb ness  weakness 7. PREGNANCY: "Is there any chance you are pregnant?" "When was your last menstrual period?"  Protocols used: ARM PAIN-A-AH

## 2018-09-19 ENCOUNTER — Emergency Department
Admission: EM | Admit: 2018-09-19 | Discharge: 2018-09-19 | Disposition: A | Discharge status: Home / Self Care | Payer: Medicaid Other | Attending: Emergency Medicine | Admitting: Emergency Medicine

## 2018-09-19 ENCOUNTER — Other Ambulatory Visit: Payer: Self-pay

## 2018-09-19 DIAGNOSIS — J45909 Unspecified asthma, uncomplicated: Secondary | ICD-10-CM | POA: Insufficient documentation

## 2018-09-19 DIAGNOSIS — F1210 Cannabis abuse, uncomplicated: Secondary | ICD-10-CM | POA: Insufficient documentation

## 2018-09-19 DIAGNOSIS — F17210 Nicotine dependence, cigarettes, uncomplicated: Secondary | ICD-10-CM | POA: Insufficient documentation

## 2018-09-19 DIAGNOSIS — M25512 Pain in left shoulder: Secondary | ICD-10-CM | POA: Insufficient documentation

## 2018-09-19 MED ORDER — KETOROLAC TROMETHAMINE 10 MG PO TABS: 10.0000 mg | ORAL_TABLET | Freq: Four times a day (QID) | ORAL | 0 refills | Status: AC | PRN

## 2018-09-19 MED ORDER — CYCLOBENZAPRINE HCL 5 MG PO TABS: ORAL_TABLET | ORAL | 0 refills | Status: AC

## 2018-09-19 MED ORDER — KETOROLAC TROMETHAMINE 30 MG/ML IJ SOLN
30.0000 mg | Freq: Once | INTRAMUSCULAR | Status: AC
  Administered 2018-09-19: 30 mg via INTRAMUSCULAR
  Filled 2018-09-19: qty 1

## 2018-09-19 MED ORDER — LIDOCAINE 5 % EX PTCH: 1.0000 | MEDICATED_PATCH | CUTANEOUS | 0 refills | Status: AC

## 2018-09-19 MED ORDER — LIDOCAINE 5 % EX PTCH
1.0000 | MEDICATED_PATCH | CUTANEOUS | Status: DC
  Administered 2018-09-19: 15:00:00 1 via TRANSDERMAL
  Filled 2018-09-19: qty 1

## 2018-09-19 NOTE — ED Notes (Signed)
Pt c/o left shoulder pain for the past 2-3 weeks, states she ran into the refrigerator at home and may have done it one too many time. States she was told by her mother that the umbilical cord was wrapped around that arm in the womb and may cause issues with nerve pain in that arm.

## 2018-09-19 NOTE — ED Provider Notes (Signed)
[facility] Emergency Department Provider Note  ____________________________________________  Time seen: Approximately 2:21 PM  I have reviewed the triage vital signs and the nursing notes.   HISTORY  Chief Complaint Shoulder Pain    HPI  Morgan Villegas is a 23 y.o. female that presents to the emergency department for evaluation of left shoulder pain for 24 years, worsening for several weeks after running into his kitchen refrigerator.    He has pain to his left shoulder when he uses arm frequently.  He states that he holds his arm to his body, to help relieve some discomfort. When pain worsens, he will get numbness in his left fingers. He has taken ibuprofen with occasional relief. Patient states that when he was a baby, his umbilical cord got wrapped around his left arm and he has had problems with his shoulder since.  He has not followed up with anyone for this.     Past Medical History:  Diagnosis Date  . Anxiety   . Asthma   . Depression   . Environmental allergies   . GERD (gastroesophageal reflux disease)   . Mood disorder (HCC)     There are no active problems to display for this patient.   History reviewed. No pertinent surgical history.  Prior to Admission medications   Medication Sig Start Date End Date Taking? Authorizing Provider  chlorhexidine (PERIDEX) 0.12 % solution Use as directed 15 mLs in the mouth or throat 2 (two) times daily. Swish and spit, do not swallow Patient not taking: Reported on 11/23/2017 11/08/17   Pauline Aus, PA-C  cyclobenzaprine (FLEXERIL) 5 MG tablet Take 1-2 tablets 3 times daily as needed 09/19/18   Enid Derry, PA-C  EPINEPHrine (EPIPEN 2-PAK IJ) Inject 1 Syringe as directed once as needed. For allergic reaction    [provider]  ketorolac (TORADOL) 10 MG tablet Take 1 tablet (10 mg total) by mouth every 6 (six) hours as needed. 09/19/18   Enid Derry, PA-C  lidocaine (LIDODERM) 5 % Place 1 patch  onto the skin daily. Remove & Discard patch within 12 hours or as directed by MD 09/19/18   Enid Derry, PA-C  omeprazole (PRILOSEC) 20 MG capsule Take 1 capsule (20 mg total) by mouth daily. Patient not taking: Reported on 11/23/2017 11/08/17   Pauline Aus, PA-C    Allergies Patient has no known allergies.  Family History  Problem Relation Age of Onset  . Hypertension Mother   . Diabetes Other   . Hypertension Other     Social History Social History   Tobacco Use  . Smoking status: Current Every Day Smoker    Packs/day: 0.00    Types: Cigarettes    Last attempt to quit: 03/30/2014    Years since quitting: 4.4  . Smokeless tobacco: Never Used  Substance Use Topics  . Alcohol use: Yes    Comment: occ  . Drug use: Yes    Types: Marijuana     Review of Systems  Cardiovascular: No chest pain. Respiratory: No SOB. Gastrointestinal: No abdominal pain.  No nausea, no vomiting.  Musculoskeletal: Positive for shoulder pain.  Skin: Negative for rash, abrasions, lacerations, ecchymosis. Neurological: Negative for headaches, numbness or tingling   ____________________________________________   PHYSICAL EXAM:  VITAL SIGNS: ED Triage Vitals [09/19/18 1354]  Enc Vitals Group     BP 120/76     Pulse Rate 81     Resp 16     Temp 99 F (37.2 C)  Temp Source Oral     SpO2 99 %     Weight 160 lb (72.6 kg)     Height 5\' 3"  (1.6 m)     Head Circumference      Peak Flow      Pain Score 10     Pain Loc      Pain Edu?      Excl. in [place]?      Constitutional: Alert and oriented. Well appearing and in no acute distress. Eyes: Conjunctivae are normal. PERRL. EOMI. Head: Atraumatic. ENT:      Ears:      Nose: No congestion/rhinnorhea.      Mouth/Throat: Mucous membranes are moist.  Neck: No stridor.No cervical spine tenderness to palpation. Cardiovascular: Normal rate, regular rhythm.  Good peripheral circulation.  Full range of motion of neck.  Symmetric radial  pulses bilaterally. Respiratory: Normal respiratory effort without tachypnea or retractions. Lungs CTAB. Good air entry to the bases with no decreased or absent breath sounds. Musculoskeletal: Full range of motion to all extremities. No gross deformities appreciated.  Full range of motion of left shoulder, with pain.  Pain elicited with abduction of left shoulder.  Strength equal in upper extremities bilaterally. Neurologic:  Normal speech and language. No gross focal neurologic deficits are appreciated.  Skin:  Skin is warm, dry and intact. No rash noted. Psychiatric: Mood and affect are normal. Speech and behavior are normal. Patient exhibits appropriate insight and judgement.   ____________________________________________   LABS (all labs ordered are listed, but only abnormal results are displayed)  Labs Reviewed - No data to display ____________________________________________  EKG   ____________________________________________  RADIOLOGY No results found.  ____________________________________________    PROCEDURES  Procedure(s) performed:    Procedures    Medications  lidocaine (LIDODERM) 5 % 1 patch (1 patch Transdermal Patch Applied 09/19/18 1454)  ketorolac (TORADOL) 30 MG/ML injection 30 mg (30 mg Intramuscular Given 09/19/18 1449)     ____________________________________________   INITIAL IMPRESSION / ASSESSMENT AND PLAN / ED COURSE  Pertinent labs & imaging results that were available during my care of the patient were reviewed by me and considered in my medical decision making (see chart for details).  Review of the [place] CSRS was performed in accordance of the [place] prior to dispensing any controlled drugs.    Patient presented to emergency department for evaluation of acute on chronic shoulder pain.  Patient was given IM Toradol for pain and inflammation.  Sling was given.  Patient will be discharged home with prescriptions for Toradol and Flexeril.  Patient is to follow up with orthopedics as directed.  Referral was given to Dr. Marry Guan.  Patient is given ED precautions to return to the ED for any worsening or new symptoms.   Morgan Villegas was evaluated in Emergency Department on 09/19/2018 for the symptoms described in the history of present illness. She was evaluated in the context of the global COVID-19 pandemic, which necessitated consideration that the patient might be at risk for infection with the SARS-CoV-2 virus that causes COVID-19. Institutional protocols and algorithms that pertain to the evaluation of patients at risk for COVID-19 are in a state of rapid change based on information released by regulatory bodies including the CDC and federal and state organizations. These policies and algorithms were followed during the patient's care in the ED.  ____________________________________________  FINAL CLINICAL IMPRESSION(S) / ED DIAGNOSES  Final diagnoses:  Acute pain of left shoulder      NEW MEDICATIONS  STARTED DURING THIS VISIT:  ED Discharge Orders         Ordered    ketorolac (TORADOL) 10 MG tablet  Every 6 hours PRN     09/19/18 1503    cyclobenzaprine (FLEXERIL) 5 MG tablet     09/19/18 1503    lidocaine (LIDODERM) 5 %  Every 24 hours     09/19/18 1503              This chart was dictated using voice recognition software/Dragon. Despite best efforts to proofread, errors can occur which can change the meaning. Any change was purely unintentional.    Enid DerryWagner, Mann Skag[place], PA-C 09/19/18 1513    Emily FilbertWilliams, Jonathan E, MD 09/20/18 [phone]

## 2018-09-19 NOTE — ED Triage Notes (Signed)
To ER c/o left shoulder pain over last 2-3weeks. Reports having difficulties with arm since "being in the womb". Pt alert and oriented X4, cooperative, RR even and unlabored, color WNL. Pt in NAD.

## 2018-11-19 ENCOUNTER — Other Ambulatory Visit: Payer: Self-pay

## 2018-11-19 ENCOUNTER — Emergency Department (HOSPITAL_COMMUNITY)
Admission: EM | Admit: 2018-11-19 | Discharge: 2018-11-19 | Disposition: A | Discharge status: Home / Self Care | Payer: Medicaid Other | Attending: Emergency Medicine | Admitting: Emergency Medicine

## 2018-11-19 ENCOUNTER — Encounter (HOSPITAL_COMMUNITY): Payer: Self-pay | Admitting: Emergency Medicine

## 2018-11-19 DIAGNOSIS — R062 Wheezing: Secondary | ICD-10-CM | POA: Insufficient documentation

## 2018-11-19 DIAGNOSIS — R05 Cough: Secondary | ICD-10-CM | POA: Insufficient documentation

## 2018-11-19 DIAGNOSIS — Z20828 Contact with and (suspected) exposure to other viral communicable diseases: Secondary | ICD-10-CM | POA: Insufficient documentation

## 2018-11-19 DIAGNOSIS — F17210 Nicotine dependence, cigarettes, uncomplicated: Secondary | ICD-10-CM | POA: Insufficient documentation

## 2018-11-19 DIAGNOSIS — J069 Acute upper respiratory infection, unspecified: Secondary | ICD-10-CM | POA: Insufficient documentation

## 2018-11-19 DIAGNOSIS — J4521 Mild intermittent asthma with (acute) exacerbation: Secondary | ICD-10-CM | POA: Insufficient documentation

## 2018-11-19 DIAGNOSIS — F1210 Cannabis abuse, uncomplicated: Secondary | ICD-10-CM | POA: Insufficient documentation

## 2018-11-19 MED ORDER — PREDNISONE 20 MG PO TABS
60.0000 mg | ORAL_TABLET | Freq: Once | ORAL | Status: AC
  Administered 2018-11-19: 60 mg via ORAL
  Filled 2018-11-19: qty 3

## 2018-11-19 MED ORDER — PREDNISONE 20 MG PO TABS: 40.0000 mg | ORAL_TABLET | Freq: Every day | ORAL | 0 refills | Status: AC

## 2018-11-19 MED ORDER — ALBUTEROL SULFATE HFA 108 (90 BASE) MCG/ACT IN AERS
4.0000 | INHALATION_SPRAY | Freq: Once | RESPIRATORY_TRACT | Status: AC
  Administered 2018-11-19: 21:00:00 4 via RESPIRATORY_TRACT
  Filled 2018-11-19: qty 6.7

## 2018-11-19 NOTE — ED Provider Notes (Signed)
[facility]-EMERGENCY DEPT Provider Note   CSN: [id number] Arrival date & time: 11/19/18  1843     History   Chief Complaint Chief Complaint  Patient presents with  . Shortness of Breath  . Cough  . Chills    HPI Morgan Villegas is a 23 y.o. female with history of asthma, anxiety, depression, environmental allergies, GERD presents for evaluation of acute onset, progressively worsening nasal congestion, wheezing, cough for 3 days.  She denies fever, sore throat, abdominal pain, nausea, or vomiting.  She does note postnasal drip and had one episode of posttussive emesis a few days ago.  She does report feeling short of breath with exertion, talking for long periods, and going up stairs which is baseline for her when her asthma flares up.  Notes some central chest tightness and soreness which worsens with cough and talking. Reports this feels very similar to URI she has experienced previously and states that she is usually seen around 3 times a year in the ED for this and is treated with a breathing treatment/albuterol and steroids.  No known sick contacts, no known Covid exposures.    The history is provided by the patient.    Past Medical History:  Diagnosis Date  . Anxiety   . Asthma   . Depression   . Environmental allergies   . GERD (gastroesophageal reflux disease)   . Mood disorder (HCC)     There are no active problems to display for this patient.   History reviewed. No pertinent surgical history.   OB History   No obstetric history on file.      Home Medications    Prior to Admission medications   Medication Sig Start Date End Date Taking? Authorizing Provider  chlorhexidine (PERIDEX) 0.12 % solution Use as directed 15 mLs in the mouth or throat 2 (two) times daily. Swish and spit, do not swallow Patient not taking: Reported on 11/23/2017 11/08/17   Pauline Aus, PA-C  cyclobenzaprine (FLEXERIL) 5 MG tablet Take 1-2 tablets 3 times daily as  needed 09/19/18   Enid Derry, PA-C  EPINEPHrine (EPIPEN 2-PAK IJ) Inject 1 Syringe as directed once as needed. For allergic reaction    [provider]  ketorolac (TORADOL) 10 MG tablet Take 1 tablet (10 mg total) by mouth every 6 (six) hours as needed. 09/19/18   Enid Derry, PA-C  lidocaine (LIDODERM) 5 % Place 1 patch onto the skin daily. Remove & Discard patch within 12 hours or as directed by MD 09/19/18   Enid Derry, PA-C  omeprazole (PRILOSEC) 20 MG capsule Take 1 capsule (20 mg total) by mouth daily. Patient not taking: Reported on 11/23/2017 11/08/17   Triplett, Tammy, PA-C  predniSONE (DELTASONE) 20 MG tablet Take 2 tablets (40 mg total) by mouth daily with breakfast for 5 days. 11/19/18 11/24/18  Jeanie Sewer, PA-C    Family History Family History  Problem Relation Age of Onset  . Hypertension Mother   . Diabetes Other   . Hypertension Other     Social History Social History   Tobacco Use  . Smoking status: Current Every Day Smoker    Packs/day: 0.00    Types: Cigarettes    Last attempt to quit: 03/30/2014    Years since quitting: 4.6  . Smokeless tobacco: Never Used  Substance Use Topics  . Alcohol use: Yes    Comment: occ  . Drug use: Yes    Types: Marijuana     Allergies  Patient has no known allergies.   Review of Systems Review of Systems  Constitutional: Negative for chills and fever.  HENT: Positive for congestion. Negative for sore throat.   Respiratory: Positive for chest tightness, shortness of breath and wheezing.   Cardiovascular: Positive for chest pain.  Gastrointestinal: Negative for abdominal pain and nausea.  All other systems reviewed and are negative.    Physical Exam Updated Vital Signs BP 139/88   Pulse 86   Temp 98.9 F (37.2 C) (Oral)   Resp 18   Ht 5\' 3"  (1.6 m)   Wt 63.5 kg   SpO2 100%   BMI 24.80 kg/m   Physical Exam Vitals signs and nursing note reviewed.  Constitutional:      General: She is not in  acute distress.    Appearance: She is well-developed.     Comments: Resting comfortably in chair  HENT:     Head: Normocephalic and atraumatic.     Nose: Congestion present.     Right Sinus: No maxillary sinus tenderness or frontal sinus tenderness.     Left Sinus: No maxillary sinus tenderness or frontal sinus tenderness.     Mouth/Throat:     Mouth: Mucous membranes are moist.     Pharynx: Oropharynx is clear. No pharyngeal swelling or oropharyngeal exudate.     Comments: No trismus, no tonsillar hypertrophy, no exudates, no uvular deviation, no trismus, no sublingual abnormalities. Eyes:     General:        Right eye: No discharge.        Left eye: No discharge.     Conjunctiva/sclera: Conjunctivae normal.  Neck:     Musculoskeletal: Normal range of motion and neck supple.     Vascular: No JVD.     Trachea: No tracheal deviation.  Cardiovascular:     Rate and Rhythm: Normal rate and regular rhythm.  Pulmonary:     Effort: Pulmonary effort is normal.     Breath sounds: Wheezing present.     Comments: Speaking in full sentences without difficulty, SPO2 saturations 96 to 100% on room air.  Diffuse expiratory wheezes.   Chest:     Chest wall: Tenderness present.     Comments: Tenderness to palpation of the anterior chest wall with no deformity, crepitus ecchymosis or flail segment.  Abdominal:     General: There is no distension.     Palpations: Abdomen is soft.     Tenderness: There is no abdominal tenderness.  Musculoskeletal:     Right lower leg: She exhibits no tenderness. No edema.     Left lower leg: She exhibits no tenderness. No edema.  Skin:    General: Skin is warm and dry.     Findings: No erythema.  Neurological:     Mental Status: She is alert.  Psychiatric:        Behavior: Behavior normal.      ED Treatments / Results  Labs (all labs ordered are listed, but only abnormal results are displayed) Labs Reviewed  NOVEL CORONAVIRUS, NAA (HOSP ORDER,  SEND-OUT TO REF LAB; TAT 18-24 HRS)    EKG None  Radiology No results found.  Procedures Procedures (including critical care time)  Medications Ordered in ED Medications  albuterol (VENTOLIN HFA) 108 (90 Base) MCG/ACT inhaler 4 puff (4 puffs Inhalation Given 11/19/18 2046)  predniSONE (DELTASONE) tablet 60 mg (60 mg Oral Given 11/19/18 2045)     Initial Impression / Assessment and Plan / ED Course  I  have reviewed the triage vital signs and the nursing notes.  Pertinent labs & imaging results that were available during my care of the patient were reviewed by me and considered in my medical decision making (see chart for details).        Morgan Villegas was evaluated in Emergency Department on 11/19/2018 for the symptoms described in the history of present illness. She was evaluated in the context of the global COVID-19 pandemic, which necessitated consideration that the patient might be at risk for infection with the SARS-CoV-2 virus that causes COVID-19. Institutional protocols and algorithms that pertain to the evaluation of patients at risk for COVID-19 are in a state of rapid change based on information released by regulatory bodies including the CDC and federal and state organizations. These policies and algorithms were followed during the patient's care in the ED.  Patient presenting for evaluation of nasal congestion, wheezing, chest pain reproducible on palpation.  She is afebrile, vital signs are stable.  She is nontoxic in appearance.  She reports that she has similar symptoms 2 or 3 times a year with a URI that resulted in asthma exacerbation.  She has diffuse expiratory wheezes on auscultation of the lungs however SPO2 saturations are within normal limits and she exhibits no signs of respiratory distress.  Unable to give a breathing treatment in the ED due to the COVID-19 pandemic however she was given a few puffs of albuterol with improvement.  She was also given p.o. prednisone.   I do not feel that she requires chest x-ray at this time given lack of fever and reassuring vital signs.  Low suspicion of PE, pneumonia, ACS/MI.  Offered outpatient Covid test, discussed quarantining at home per current CDC guidelines.  Discussed symptomatic management.  We will refill her albuterol inhaler and send her home with a course of prednisone.  Discussed strict ED return precautions.  Recommend follow-up with PCP for reevaluation of symptoms.  Patient verbalized understanding of and agreement with plan and is safe for discharge home at this time.  She has no complaints prior to discharge  Final Clinical Impressions(s) / ED Diagnoses   Final diagnoses:  Viral URI with cough  Wheezing  Mild intermittent asthma with exacerbation    ED Discharge Orders         Ordered    predniSONE (DELTASONE) 20 MG tablet  Daily with breakfast     11/19/18 2010           Debroah Baller 11/19/18 2135    Milton Ferguson, MD 11/20/18 [id number]

## 2018-11-19 NOTE — Discharge Instructions (Signed)
Use the albuterol inhaler 1 to 2 puffs every 4-6 hours as needed for shortness of breath and wheezing.  Start taking prednisone as prescribed beginning tomorrow, you received the first dose in the emergency department today.  Drink plenty fluids and get plenty of rest.  You can take 1 to 2 tablets of Tylenol every 6 hours as needed for pain or fever.  Your Covid test will result within 48 hours.  You will receive a phone call if your test is positive, no phone call if your test is negative.  You can also review your results on MyChart.  Return to the emergency department if any concerning signs or symptoms develop such as worsening shortness of breath, high fevers, persistent vomiting, or loss of consciousness.

## 2018-11-19 NOTE — ED Triage Notes (Signed)
Patient presents with complaints of cough and shortness of breath, She reports this is how she usually feels when she has bad seasonal allergies. She usually get shots for this, but has been unable to this time.

## 2018-11-21 LAB — NOVEL CORONAVIRUS, NAA (HOSP ORDER, SEND-OUT TO REF LAB; TAT 18-24 HRS): SARS-CoV-2, NAA: NOT DETECTED

## 2019-04-24 ENCOUNTER — Emergency Department (HOSPITAL_COMMUNITY)
Admission: EM | Admit: 2019-04-24 | Discharge: 2019-04-24 | Disposition: A | Discharge status: Home / Self Care | Payer: No Typology Code available for payment source | Attending: Emergency Medicine | Admitting: Emergency Medicine

## 2019-04-24 ENCOUNTER — Other Ambulatory Visit: Payer: Self-pay

## 2019-04-24 DIAGNOSIS — R05 Cough: Secondary | ICD-10-CM | POA: Insufficient documentation

## 2019-04-24 DIAGNOSIS — R519 Headache, unspecified: Secondary | ICD-10-CM | POA: Diagnosis not present

## 2019-04-24 DIAGNOSIS — Z5321 Procedure and treatment not carried out due to patient leaving prior to being seen by health care provider: Secondary | ICD-10-CM | POA: Diagnosis not present

## 2019-04-24 DIAGNOSIS — R0789 Other chest pain: Secondary | ICD-10-CM | POA: Diagnosis not present

## 2019-04-24 NOTE — ED Triage Notes (Signed)
Patient c/o URI symptoms, cough, congestion and nasal drainage along w/ headache and chest pain. Concern for allergy and states she usually gets a shot.

## 2019-04-24 NOTE — ED Notes (Signed)
Pt not responding for room.

## 2019-04-25 ENCOUNTER — Encounter (HOSPITAL_COMMUNITY): Payer: Self-pay | Admitting: Emergency Medicine

## 2019-04-25 ENCOUNTER — Emergency Department (HOSPITAL_COMMUNITY)
Admission: EM | Admit: 2019-04-25 | Discharge: 2019-04-25 | Disposition: A | Discharge status: Home / Self Care | Payer: No Typology Code available for payment source | Attending: Emergency Medicine | Admitting: Emergency Medicine

## 2019-04-25 ENCOUNTER — Emergency Department (HOSPITAL_COMMUNITY): Payer: No Typology Code available for payment source

## 2019-04-25 ENCOUNTER — Other Ambulatory Visit: Payer: Self-pay

## 2019-04-25 DIAGNOSIS — F17210 Nicotine dependence, cigarettes, uncomplicated: Secondary | ICD-10-CM | POA: Diagnosis not present

## 2019-04-25 DIAGNOSIS — J4521 Mild intermittent asthma with (acute) exacerbation: Secondary | ICD-10-CM

## 2019-04-25 DIAGNOSIS — J45909 Unspecified asthma, uncomplicated: Secondary | ICD-10-CM | POA: Insufficient documentation

## 2019-04-25 DIAGNOSIS — Z20822 Contact with and (suspected) exposure to covid-19: Secondary | ICD-10-CM | POA: Insufficient documentation

## 2019-04-25 DIAGNOSIS — R05 Cough: Secondary | ICD-10-CM | POA: Diagnosis present

## 2019-04-25 LAB — SARS CORONAVIRUS 2 (TAT 6-24 HRS): SARS Coronavirus 2: NEGATIVE

## 2019-04-25 MED ORDER — PREDNISONE 10 MG PO TABS: 40.0000 mg | ORAL_TABLET | Freq: Every day | ORAL | 0 refills | Status: AC

## 2019-04-25 MED ORDER — IPRATROPIUM BROMIDE HFA 17 MCG/ACT IN AERS
2.0000 | INHALATION_SPRAY | Freq: Once | RESPIRATORY_TRACT | Status: AC
  Administered 2019-04-25: 2 via RESPIRATORY_TRACT
  Filled 2019-04-25: qty 12.9

## 2019-04-25 MED ORDER — PREDNISONE 20 MG PO TABS
60.0000 mg | ORAL_TABLET | Freq: Once | ORAL | Status: AC
  Administered 2019-04-25: 60 mg via ORAL
  Filled 2019-04-25: qty 3

## 2019-04-25 MED ORDER — ALBUTEROL SULFATE HFA 108 (90 BASE) MCG/ACT IN AERS
3.0000 | INHALATION_SPRAY | Freq: Once | RESPIRATORY_TRACT | Status: AC
  Administered 2019-04-25: 12:00:00 3 via RESPIRATORY_TRACT
  Filled 2019-04-25: qty 6.7

## 2019-04-25 NOTE — ED Triage Notes (Signed)
Pt c/o cough, congestion, headache and chest pains when coughing for a couple days. Reports allergies and was outside on Sunday. She reports normally gets allergy shots but hasnt in a while.

## 2019-04-25 NOTE — ED Provider Notes (Signed)
[facility]-EMERGENCY DEPT Provider Note   CSN: [id number] Arrival date & time: 04/25/19  1054     History Chief Complaint  Patient presents with  . Nasal Congestion  . Cough  . Headache    Morgan Villegas is a 24 y.o. female with a past medical history of asthma, seasonal allergies, presenting to the ED with a chief complaint of cough and nasal congestion.  3 days ago started having cough productive with mucus as well as rhinorrhea, chest tightness and wheezing.  Minimal improvement noted with home inhaler.  Has not tried any medications to help with symptoms other than this.  Reports that the symptoms feel similar to prior asthma exacerbations that she has had in the past.  States that these usually happen around this time of the year.  Reports sick contacts with similar symptoms and is concerned that she may also have Covid.  Denies any chest pain, hemoptysis, leg swelling, vomiting, fever or known Covid exposure.  HPI     Past Medical History:  Diagnosis Date  . Anxiety   . Asthma   . Depression   . Environmental allergies   . GERD (gastroesophageal reflux disease)   . Mood disorder (HCC)     There are no problems to display for this patient.   History reviewed. No pertinent surgical history.   OB History   No obstetric history on file.     Family History  Problem Relation Age of Onset  . Hypertension Mother   . Diabetes Other   . Hypertension Other     Social History   Tobacco Use  . Smoking status: Current Every Day Smoker    Packs/day: 0.00    Types: Cigarettes    Last attempt to quit: 03/30/2014    Years since quitting: 5.0  . Smokeless tobacco: Never Used  Substance Use Topics  . Alcohol use: Yes    Comment: occ  . Drug use: Yes    Types: Marijuana    Home Medications Prior to Admission medications   Medication Sig Start Date End Date Taking? Authorizing Provider  chlorhexidine (PERIDEX) 0.12 % solution Use as  directed 15 mLs in the mouth or throat 2 (two) times daily. Swish and spit, do not swallow Patient not taking: Reported on 11/23/2017 11/08/17   Pauline Aus, PA-C  cyclobenzaprine (FLEXERIL) 5 MG tablet Take 1-2 tablets 3 times daily as needed 09/19/18   Enid Derry, PA-C  EPINEPHrine (EPIPEN 2-PAK IJ) Inject 1 Syringe as directed once as needed. For allergic reaction    [provider]  ketorolac (TORADOL) 10 MG tablet Take 1 tablet (10 mg total) by mouth every 6 (six) hours as needed. 09/19/18   Enid Derry, PA-C  lidocaine (LIDODERM) 5 % Place 1 patch onto the skin daily. Remove & Discard patch within 12 hours or as directed by MD 09/19/18   Enid Derry, PA-C  omeprazole (PRILOSEC) 20 MG capsule Take 1 capsule (20 mg total) by mouth daily. Patient not taking: Reported on 11/23/2017 11/08/17   Pauline Aus, PA-C    Allergies    Patient has no known allergies.  Review of Systems   Review of Systems  Constitutional: Negative for chills and fever.  HENT: Positive for postnasal drip and rhinorrhea. Negative for sore throat and trouble swallowing.   Respiratory: Positive for cough, chest tightness, shortness of breath and wheezing.   Cardiovascular: Negative for chest pain.    Physical Exam Updated Vital Signs BP 134/85 (BP  Location: Left Arm)   Pulse 89   Temp 99.2 F (37.3 C) (Oral)   Resp 17   LMP 04/23/2019   SpO2 99%   Physical Exam Vitals and nursing note reviewed.  Constitutional:      General: She is not in acute distress.    Appearance: She is well-developed. She is not diaphoretic.  HENT:     Head: Normocephalic and atraumatic.     Nose: Congestion present.     Mouth/Throat:     Tonsils: No tonsillar exudate or tonsillar abscesses.  Eyes:     General: No scleral icterus.    Conjunctiva/sclera: Conjunctivae normal.  Cardiovascular:     Rate and Rhythm: Normal rate and regular rhythm.  Pulmonary:     Effort: Pulmonary effort is normal. No  respiratory distress.     Breath sounds: Wheezing (Slight end expiratory wheezing bilaterally) present.  Musculoskeletal:     Cervical back: Normal range of motion.  Skin:    Findings: No rash.  Neurological:     Mental Status: She is alert.     ED Results / Procedures / Treatments   Labs (all labs ordered are listed, but only abnormal results are displayed) Labs Reviewed  SARS CORONAVIRUS 2 (TAT 6-24 HRS)    EKG None  Radiology DG Chest Portable 1 View  Result Date: 04/25/2019 CLINICAL DATA:  Cough EXAM: PORTABLE CHEST 1 VIEW COMPARISON:  July 18, 2018 FINDINGS: The heart size and mediastinal contours are within normal limits. Both lungs are clear. No pleural effusion. The visualized skeletal structures are unremarkable. IMPRESSION: No acute process in the chest. Electronically Signed   By: Guadlupe Spanish M.D.   On: 04/25/2019 12:02    Procedures Procedures (including critical care time)  Medications Ordered in ED Medications  predniSONE (DELTASONE) tablet 60 mg (60 mg Oral Given 04/25/19 1200)  albuterol (VENTOLIN HFA) 108 (90 Base) MCG/ACT inhaler 3 puff (3 puffs Inhalation Given 04/25/19 1201)  ipratropium (ATROVENT HFA) inhaler 2 puff (2 puffs Inhalation Given 04/25/19 1201)    ED Course  I have reviewed the triage vital signs and the nursing notes.  Pertinent labs & imaging results that were available during my care of the patient were reviewed by me and considered in my medical decision making (see chart for details).    MDM Rules/Calculators/A&P                      Glender Augusta was evaluated in Emergency Department on 04/25/19 for the symptoms described in the history of present illness. He/she was evaluated in the context of the global COVID-19 pandemic, which necessitated consideration that the patient might be at risk for infection with the SARS-CoV-2 virus that causes COVID-19. Institutional protocols and algorithms that pertain to the evaluation of patients  at risk for COVID-19 are in a state of rapid change based on information released by regulatory bodies including the CDC and federal and state organizations. These policies and algorithms were followed during the patient's care in the ED.  24 year old female with past medical history of asthma and seasonal allergies presenting to the ED with a chief complaint of chest tightness, cough and rhinorrhea.  Symptoms have been going on for the past 3 days.  Reports similar symptoms around this time of the year due to environmental triggers.  She is also concerned that she may have Covid and is requesting a Covid test.  She had minimal improvement with her home inhalers.  On  exam there is some expiratory wheezing noted bilaterally.  She is speaking complete sentences without difficulty.  Oxygen saturations above 99% on room air.  Chest x-ray shows no acute findings.  Covid test is pending.  She reports significant improvement with albuterol, ipratropium and prednisone given today.  Suspect that the symptoms could be due to asthma exacerbation in the setting of a viral illness and seasonal triggers.  We will have her continue steroid Dosepak and use inhalers as needed.  Patient is hemodynamically stable, in NAD, and able to ambulate in the ED. Evaluation does not show pathology that would require ongoing emergent intervention or inpatient treatment. I have personally reviewed and interpreted all lab work and imaging at today's ED visit. I explained the diagnosis to the patient. Pain has been managed and has no complaints prior to discharge. Patient is comfortable with above plan and is stable for discharge at this time. All questions were answered prior to disposition. Strict return precautions for returning to the ED were discussed. Encouraged follow up with PCP.   An After Visit Summary was printed and given to the patient.   Portions of this note were generated with [employer]. Dictation errors may  occur despite best attempts at proofreading.  Final Clinical Impression(s) / ED Diagnoses Final diagnoses:  Suspected COVID-19 virus infection  Exacerbation of intermittent asthma, unspecified asthma severity    Rx / DC Orders ED Discharge Orders    None       Delia Heady, PA-C 04/25/19 1226    Tegeler, Gwenyth Allegra, MD 04/25/19 1742

## 2019-04-25 NOTE — Discharge Instructions (Addendum)
Take the steroids beginning tomorrow. You can also take the inhalers as needed. Return to the ER if you start to experience worsening chest tightness, wheezing, shortness of breath, chest pain. We will contact you with results of your Covid test when it is available.

## 2019-04-25 NOTE — ED Notes (Signed)
Pt d/c'd by Kasandra Knudsen., RN

## 2019-08-21 IMAGING — DX DG CHEST 2V
2 series · 2 of 2 positions shown · non-contrast
Comparison: None.

CLINICAL DATA: Cough.

EXAM:
CHEST - 2 VIEW

[chest pa]
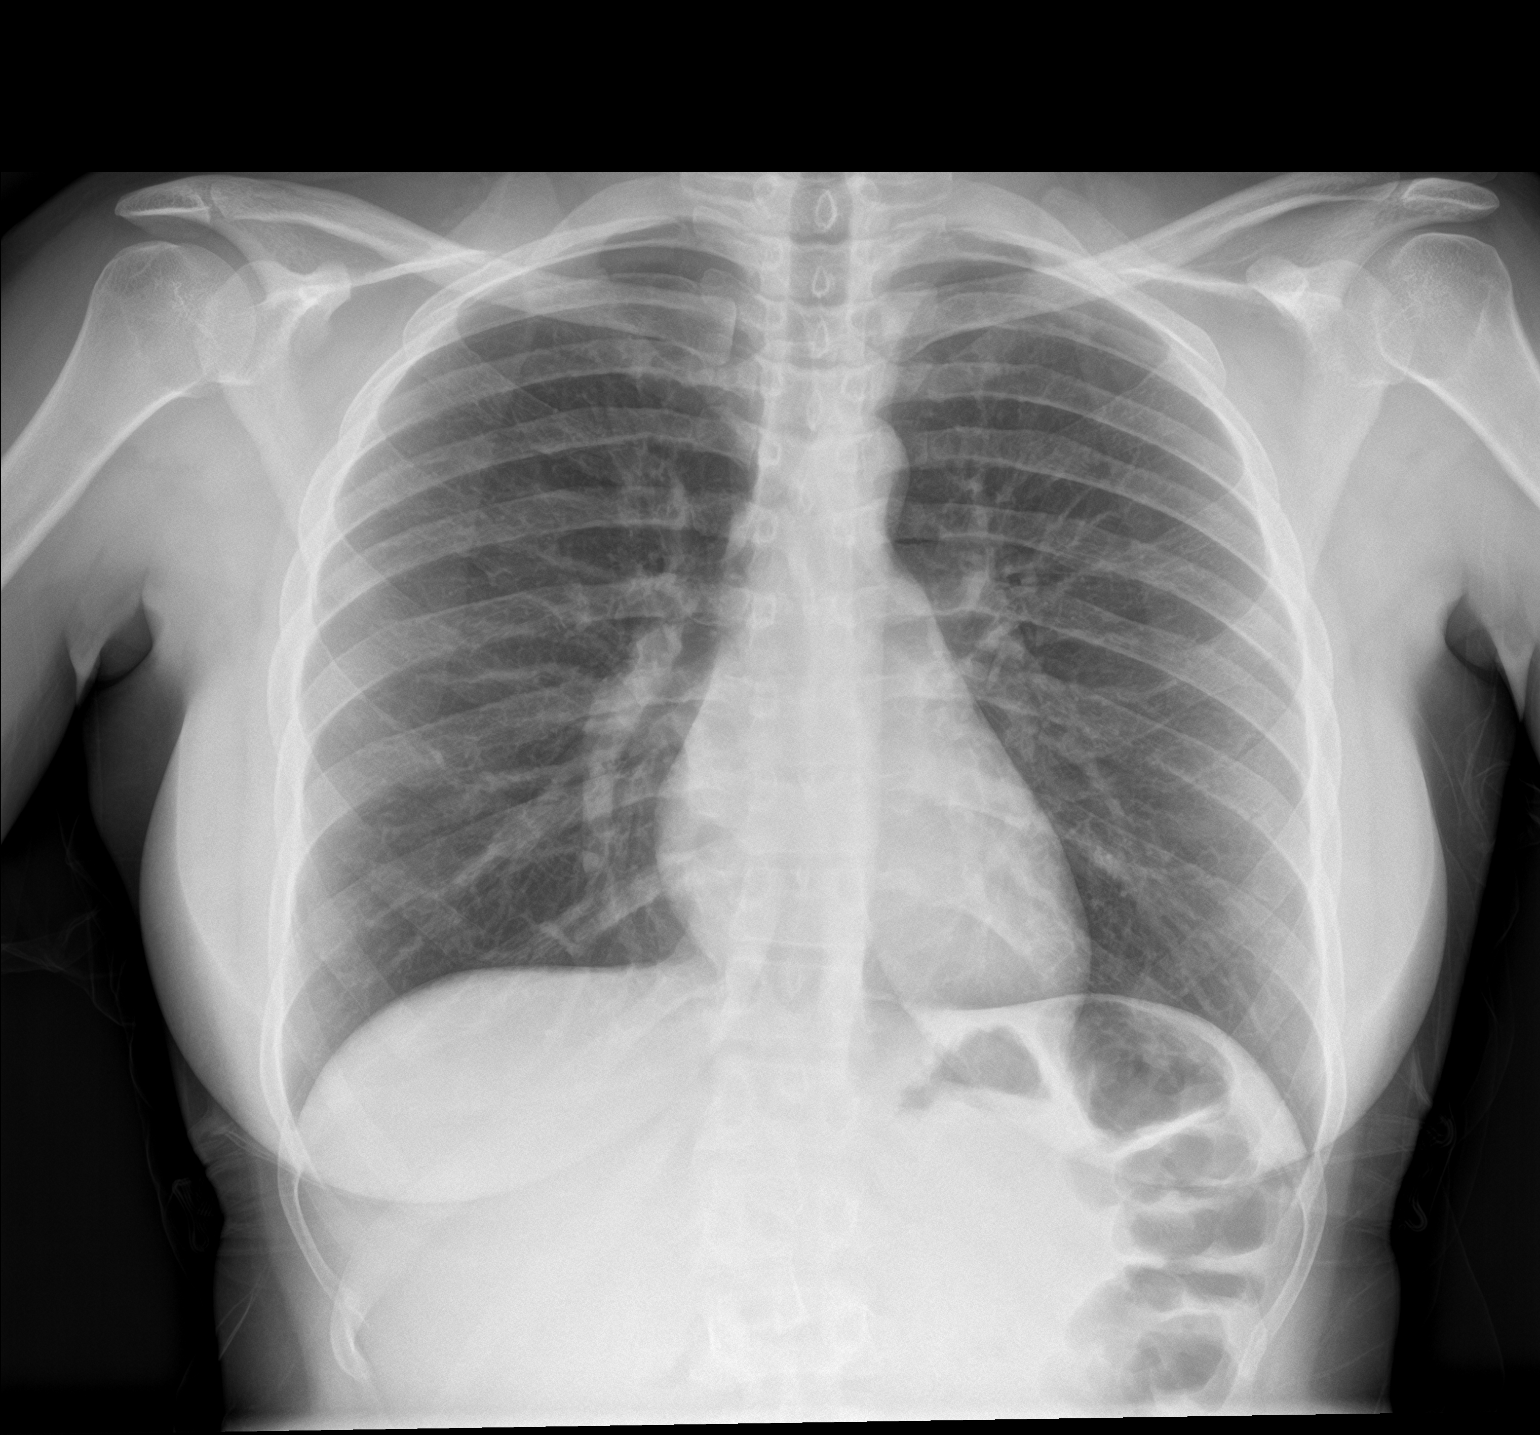

[chest lat]
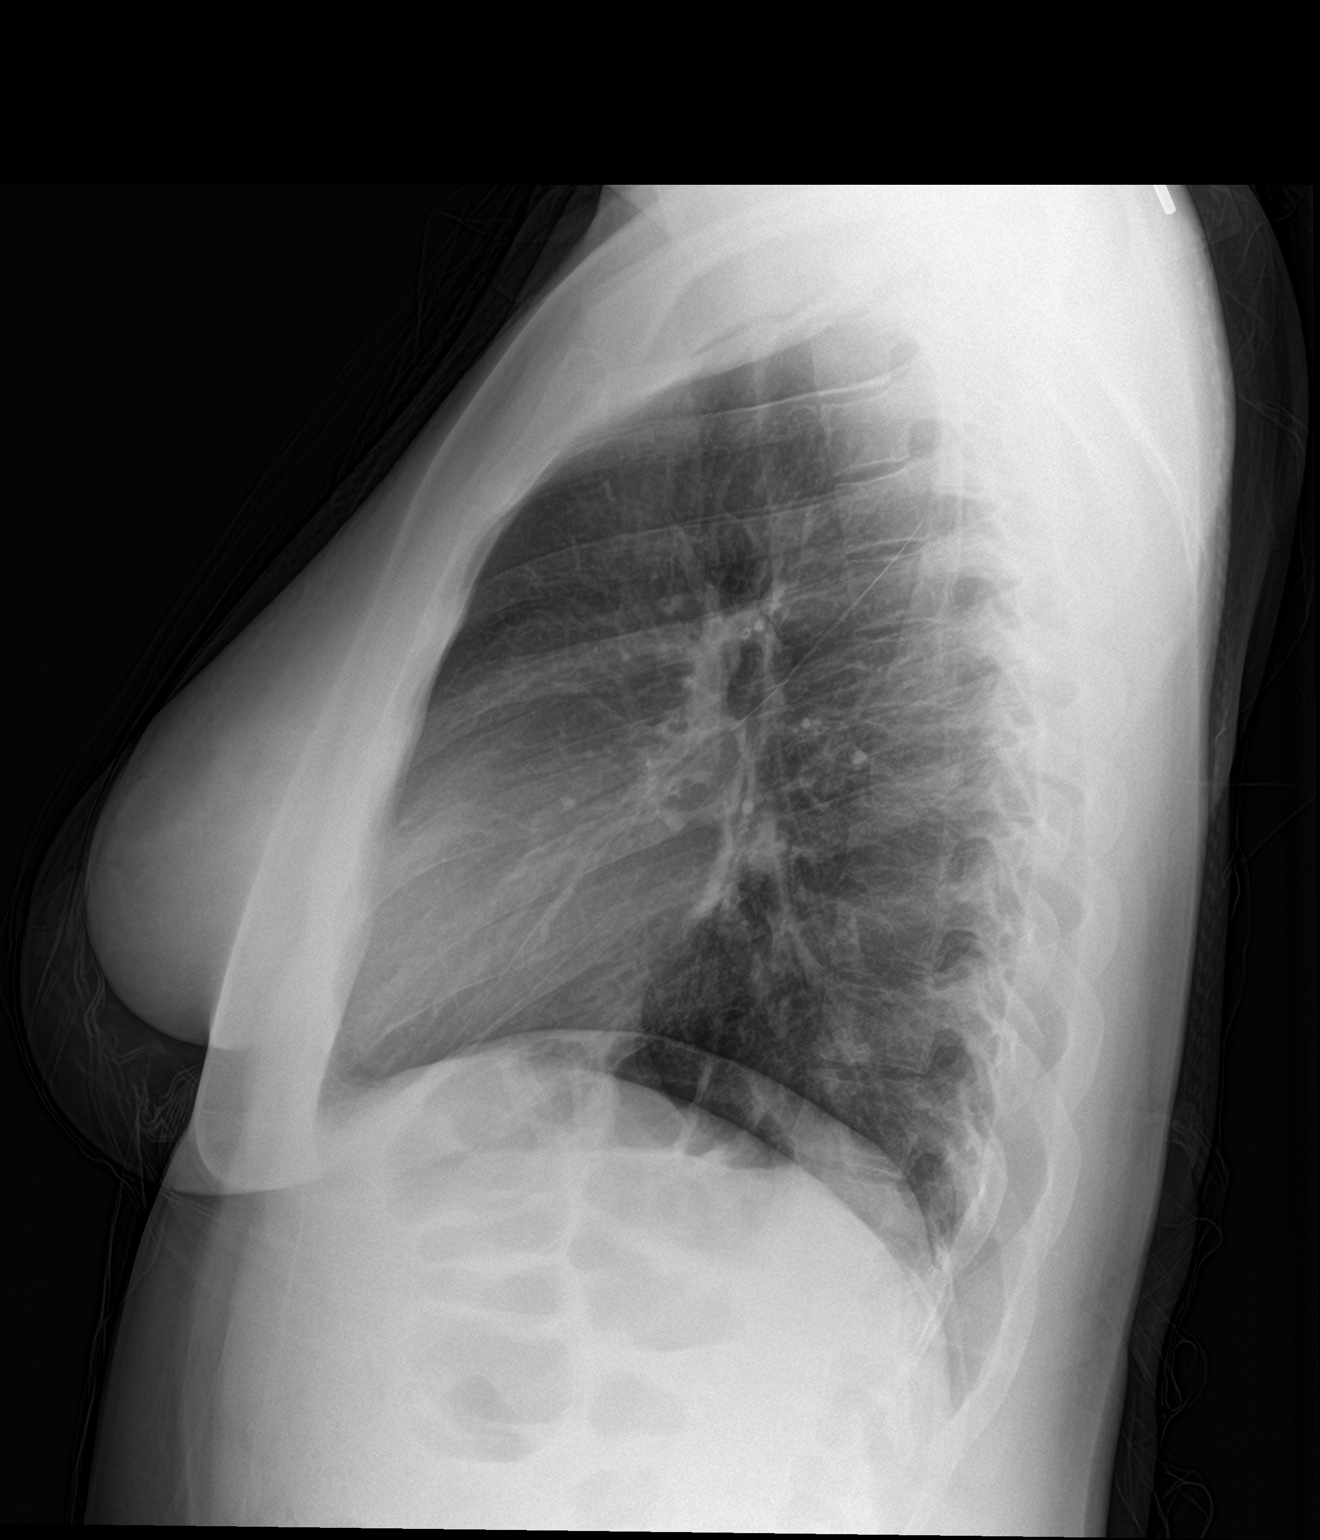

[2 of 2 positions shown; findings below may reference images not displayed]

FINDINGS: The heart size and mediastinal contours are within normal limits.
Both lungs are clear. The visualized skeletal structures are
unremarkable.
IMPRESSION: Negative two view chest x-ray

## 2019-09-01 ENCOUNTER — Emergency Department (HOSPITAL_COMMUNITY)
Admission: EM | Admit: 2019-09-01 | Discharge: 2019-09-01 | Disposition: A | Discharge status: Home / Self Care | Payer: No Typology Code available for payment source | Attending: Emergency Medicine | Admitting: Emergency Medicine

## 2019-09-01 ENCOUNTER — Encounter (HOSPITAL_COMMUNITY): Payer: Self-pay | Admitting: Emergency Medicine

## 2019-09-01 ENCOUNTER — Emergency Department (HOSPITAL_COMMUNITY): Payer: No Typology Code available for payment source

## 2019-09-01 ENCOUNTER — Other Ambulatory Visit: Payer: Self-pay

## 2019-09-01 DIAGNOSIS — R059 Cough, unspecified: Secondary | ICD-10-CM

## 2019-09-01 DIAGNOSIS — R519 Headache, unspecified: Secondary | ICD-10-CM | POA: Diagnosis not present

## 2019-09-01 DIAGNOSIS — J45909 Unspecified asthma, uncomplicated: Secondary | ICD-10-CM | POA: Insufficient documentation

## 2019-09-01 DIAGNOSIS — U071 COVID-19: Secondary | ICD-10-CM | POA: Insufficient documentation

## 2019-09-01 DIAGNOSIS — F17210 Nicotine dependence, cigarettes, uncomplicated: Secondary | ICD-10-CM | POA: Insufficient documentation

## 2019-09-01 DIAGNOSIS — R05 Cough: Secondary | ICD-10-CM

## 2019-09-01 LAB — PREGNANCY, URINE: Preg Test, Ur: NEGATIVE

## 2019-09-01 LAB — CBG MONITORING, ED: Glucose-Capillary: 99 mg/dL (ref 70–99)

## 2019-09-01 LAB — SARS CORONAVIRUS 2 BY RT PCR (HOSPITAL ORDER, PERFORMED IN ~~LOC~~ HOSPITAL LAB): SARS Coronavirus 2: POSITIVE — AB

## 2019-09-01 MED ORDER — PROCHLORPERAZINE MALEATE 5 MG PO TABS
10.0000 mg | ORAL_TABLET | Freq: Once | ORAL | Status: AC
  Administered 2019-09-01: 10 mg via ORAL
  Filled 2019-09-01: qty 2

## 2019-09-01 MED ORDER — DIPHENHYDRAMINE HCL 25 MG PO CAPS
25.0000 mg | ORAL_CAPSULE | Freq: Once | ORAL | Status: AC
  Administered 2019-09-01: 25 mg via ORAL
  Filled 2019-09-01: qty 1

## 2019-09-01 MED ORDER — KETOROLAC TROMETHAMINE 60 MG/2ML IM SOLN
60.0000 mg | Freq: Once | INTRAMUSCULAR | Status: AC
  Administered 2019-09-01: 60 mg via INTRAMUSCULAR
  Filled 2019-09-01: qty 2

## 2019-09-01 NOTE — ED Provider Notes (Signed)
[facility] EMERGENCY DEPARTMENT Provider Note   CSN: [id number] Arrival date & time: 09/01/19  1026     History Chief Complaint  Patient presents with  . Cough  . Headache    Morgan Villegas is a 24 y.o. female.  The history is provided by the patient.  Cough Cough characteristics:  Productive Sputum characteristics:  Yellow Severity:  Moderate Onset quality:  Sudden Duration:  3 days Timing:  Constant Progression:  Worsening Chronicity:  New Relieved by:  Nothing Worsened by:  Nothing Associated symptoms: chills, ear pain, fever and headaches   Associated symptoms: no chest pain, no rash, no shortness of breath and no sore throat   Headache Associated symptoms: cough, ear pain and fever   Associated symptoms: no abdominal pain, no back pain, no eye pain, no seizures, no sore throat and no vomiting        Past Medical History:  Diagnosis Date  . Anxiety   . Asthma   . Depression   . Environmental allergies   . GERD (gastroesophageal reflux disease)   . Mood disorder (HCC)     There are no problems to display for this patient.   History reviewed. No pertinent surgical history.   OB History   No obstetric history on file.     Family History  Problem Relation Age of Onset  . Hypertension Mother   . Diabetes Other   . Hypertension Other     Social History   Tobacco Use  . Smoking status: Current Every Day Smoker    Packs/day: 0.00    Types: Cigarettes    Last attempt to quit: 03/30/2014    Years since quitting: 5.4  . Smokeless tobacco: Never Used  Vaping Use  . Vaping Use: Never used  Substance Use Topics  . Alcohol use: Yes    Comment: occ  . Drug use: Yes    Types: Marijuana    Home Medications Prior to Admission medications   Medication Sig Start Date End Date Taking? Authorizing Provider  chlorhexidine (PERIDEX) 0.12 % solution Use as directed 15 mLs in the mouth or throat 2 (two) times daily. Swish and  spit, do not swallow Patient not taking: Reported on 11/23/2017 11/08/17   Pauline Aus, PA-C  cyclobenzaprine (FLEXERIL) 5 MG tablet Take 1-2 tablets 3 times daily as needed 09/19/18   Enid Derry, PA-C  EPINEPHrine (EPIPEN 2-PAK IJ) Inject 1 Syringe as directed once as needed. For allergic reaction    [provider]  ketorolac (TORADOL) 10 MG tablet Take 1 tablet (10 mg total) by mouth every 6 (six) hours as needed. 09/19/18   Enid Derry, PA-C  lidocaine (LIDODERM) 5 % Place 1 patch onto the skin daily. Remove & Discard patch within 12 hours or as directed by MD 09/19/18   Enid Derry, PA-C  omeprazole (PRILOSEC) 20 MG capsule Take 1 capsule (20 mg total) by mouth daily. Patient not taking: Reported on 11/23/2017 11/08/17   Pauline Aus, PA-C    Allergies    Patient has no known allergies.  Review of Systems   Review of Systems  Constitutional: Positive for chills and fever.  HENT: Positive for ear pain. Negative for sore throat.   Eyes: Negative for pain and visual disturbance.  Respiratory: Positive for cough. Negative for shortness of breath.   Cardiovascular: Negative for chest pain and palpitations.  Gastrointestinal: Negative for abdominal pain and vomiting.  Genitourinary: Negative for dysuria and hematuria.  Musculoskeletal: Negative for  arthralgias and back pain.  Skin: Negative for color change and rash.  Neurological: Positive for headaches. Negative for seizures and syncope.  All other systems reviewed and are negative.   Physical Exam Updated Vital Signs BP 122/84 (BP Location: Right Arm)   Pulse 74   Temp 99.3 F (37.4 C) (Oral)   Resp 16   LMP 08/01/2019   SpO2 100%   Physical Exam Vitals and nursing note reviewed.  Constitutional:      General: She is not in acute distress.    Appearance: She is well-developed.  HENT:     Head: Normocephalic and atraumatic.     Comments: Clear TM bl. Eyes:     Extraocular Movements: Extraocular  movements intact.     Conjunctiva/sclera: Conjunctivae normal.     Pupils: Pupils are equal, round, and reactive to light.  Cardiovascular:     Rate and Rhythm: Normal rate and regular rhythm.     Heart sounds: No murmur heard.   Pulmonary:     Effort: Pulmonary effort is normal. No respiratory distress.     Breath sounds: Normal breath sounds.  Abdominal:     Palpations: Abdomen is soft.     Tenderness: There is no abdominal tenderness.  Musculoskeletal:     Cervical back: Neck supple. No rigidity.  Skin:    General: Skin is warm and dry.     Capillary Refill: Capillary refill takes less than 2 seconds.  Neurological:     Mental Status: She is alert and oriented to person, place, and time. Mental status is at baseline.     GCS: GCS eye subscore is 4. GCS verbal subscore is 5. GCS motor subscore is 6.     Cranial Nerves: No cranial nerve deficit or dysarthria.     Sensory: No sensory deficit.     Motor: No weakness.  Psychiatric:        Mood and Affect: Mood normal. Mood is not anxious or depressed.        Speech: Speech normal.        Behavior: Behavior normal.     ED Results / Procedures / Treatments   Labs (all labs ordered are listed, but only abnormal results are displayed) Labs Reviewed  SARS CORONAVIRUS 2 BY RT PCR (HOSPITAL ORDER, PERFORMED IN [facility]) - Abnormal; Notable for the following components:      Result Value   SARS Coronavirus 2 POSITIVE (*)    All other components within normal limits  PREGNANCY, URINE  CBG MONITORING, ED    EKG None  Radiology DG Chest 1 View  Result Date: 09/01/2019 CLINICAL DATA:  Cough and shortness of breath. EXAM: CHEST  1 VIEW COMPARISON:  April 25, 2019 FINDINGS: There is no evidence of acute infiltrate, pleural effusion or pneumothorax. The heart size and mediastinal contours are within normal limits. The visualized skeletal structures are unremarkable. IMPRESSION: No active disease. Electronically Signed    By: Aram Candela M.D.   On: 09/01/2019 17:08    Procedures Procedures (including critical care time)  Medications Ordered in ED Medications  ketorolac (TORADOL) injection 60 mg (60 mg Intramuscular Given 09/01/19 1819)  prochlorperazine (COMPAZINE) tablet 10 mg (10 mg Oral Given 09/01/19 1818)  diphenhydrAMINE (BENADRYL) capsule 25 mg (25 mg Oral Given 09/01/19 1818)    ED Course  I have reviewed the triage vital signs and the nursing notes.  Pertinent labs & imaging results that were available during my care of the patient  were reviewed by me and considered in my medical decision making (see chart for details).    MDM Rules/Calculators/A&P                          24 year old female who presents with cough, headache, fevers and chills.  States that she been having symptoms that have started with an earache for the past 3 days.  States that she has since developed a productive cough as well as fevers and chills.  She also endorses head pressure which she attributes to hitting her head frequently.  Endorses chest pain only while coughing and denies nausea, vomiting, diarrhea, loss of taste, loss of smell.  No known Covid exposures.  Patient has not been vaccinated.  Afebrile vital signs stable.  Exam as above.  Nonfocal neurologic exam with no weakness or sensory deficits.  Lungs clear to auscultation bilaterally.  Patient symptoms may be secondary to Covid.  Will obtain Covid swab as well as chest x-ray and reassess.  UPT negative.  Chest x-ray negative for acute cardiopulmonary disease.  Patient given Toradol as well as Compazine with improvement of her headache.  Covid positive.  Do not feel that patient requires CT head or other intracranial imaging at this time.  Upon discussions with the patient she is very concerned about her continuously hitting her head.  Patient has no neurologic deficits, is currently at baseline, alert oriented x4 and is not on any blood thinners and the  mechanisms do not appear severe enough to cause significant intracranial injury.  This was discussed with the patient in extensive discussions were had about why we are not imaging her head today.  After these discussions patient voiced agreement and understanding of this reasoning.  Patient's headache is likely secondary to her Covid.  Quarantine precautions were reviewed with the patient and she was given strict ED return precautions.  Referral was made for patient to follow-up in the post Covid care center and a message was sent to the monoclonal antibody infusion clinic given the patient has a history of asthma.  Patient voiced agreement and understanding of the overall plan.  Patient was discharged in stable condition without further events.  Final Clinical Impression(s) / ED Diagnoses Final diagnoses:  Acute nonintractable headache, unspecified headache type  Cough  COVID-19    Rx / DC Orders ED Discharge Orders    None       Rickey Primus, MD 09/01/19 1844    Alvira Monday, MD 09/03/19 [phone]

## 2019-09-01 NOTE — ED Notes (Signed)
Patient given discharge instructions. Questions were answered. Patient verbalized understanding of discharge instructions and care at home.  

## 2019-09-01 NOTE — ED Triage Notes (Signed)
Pt reports cough with yellowish- green phlegm, sneezing, headache, hot/cold flashes, and generalized body aches x 3 days.  No known COVID exposures.

## 2019-09-02 ENCOUNTER — Other Ambulatory Visit (HOSPITAL_COMMUNITY): Payer: Self-pay | Admitting: Nurse Practitioner

## 2019-09-02 ENCOUNTER — Encounter: Payer: Self-pay | Admitting: Nurse Practitioner

## 2019-09-02 DIAGNOSIS — U071 COVID-19: Secondary | ICD-10-CM

## 2019-09-02 NOTE — Progress Notes (Signed)
I connected by phone with Morgan Villegas on 09/02/2019 at 9:32 AM to discuss the potential use of an new treatment for mild to moderate COVID-19 viral infection in non-hospitalized patients.  This patient is a 24 y.o. female that meets the FDA criteria for Emergency Use Authorization of casirivimab\imdevimab.  Has a (+) direct SARS-CoV-2 viral test result  Has mild or moderate COVID-19   Is ? 25 years of age and weighs ? 40 kg  Is NOT hospitalized due to COVID-19  Is NOT requiring oxygen therapy or requiring an increase in baseline oxygen flow rate due to COVID-19  Is within 10 days of symptom onset  Has at least one of the high risk factor(s) for progression to severe COVID-19 and/or hospitalization as defined in EUA.  Specific high risk criteria : Chronic Lung Disease, social vulnerability  Sx started 08/28/19.    I have spoken and communicated the following to the patient or parent/caregiver:  1. FDA has authorized the emergency use of casirivimab\imdevimab for the treatment of mild to moderate COVID-19 in adults and pediatric patients with positive results of direct SARS-CoV-2 viral testing who are 29 years of age and older weighing at least 40 kg, and who are at high risk for progressing to severe COVID-19 and/or hospitalization.  2. The significant known and potential risks and benefits of casirivimab\imdevimab, and the extent to which such potential risks and benefits are unknown.  3. Information on available alternative treatments and the risks and benefits of those alternatives, including clinical trials.  4. Patients treated with casirivimab\imdevimab should continue to self-isolate and use infection control measures (e.g., wear mask, isolate, social distance, avoid sharing personal items, clean and disinfect "high touch" surfaces, and frequent handwashing) according to CDC guidelines.   5. The patient or parent/caregiver has the option to accept or refuse  casirivimab\imdevimab .  After reviewing this information with the patient, The patient agreed to proceed with receiving casirivimab\imdevimab infusion and will be provided a copy of the Fact sheet prior to receiving the infusion.Consuello Masse, DNP, AGNP-C [phone] (Infusion Center Hotline)

## 2019-09-03 ENCOUNTER — Ambulatory Visit (HOSPITAL_COMMUNITY)
Admission: RE | Admit: 2019-09-03 | Discharge: 2019-09-03 | Disposition: A | Discharge status: Home / Self Care | Payer: No Typology Code available for payment source | Source: Ambulatory Visit | Attending: Pulmonary Disease | Admitting: Pulmonary Disease

## 2019-09-03 DIAGNOSIS — U071 COVID-19: Secondary | ICD-10-CM | POA: Diagnosis not present

## 2019-09-03 DIAGNOSIS — J449 Chronic obstructive pulmonary disease, unspecified: Secondary | ICD-10-CM | POA: Insufficient documentation

## 2019-09-03 MED ORDER — FAMOTIDINE IN NACL 20-0.9 MG/50ML-% IV SOLN: 20.0000 mg | Freq: Once | INTRAVENOUS | Status: DC | PRN

## 2019-09-03 MED ORDER — SODIUM CHLORIDE 0.9 % IV SOLN: INTRAVENOUS | Status: DC | PRN

## 2019-09-03 MED ORDER — METHYLPREDNISOLONE SODIUM SUCC 125 MG IJ SOLR: 125.0000 mg | Freq: Once | INTRAMUSCULAR | Status: DC | PRN

## 2019-09-03 MED ORDER — DIPHENHYDRAMINE HCL 50 MG/ML IJ SOLN: 50.0000 mg | Freq: Once | INTRAMUSCULAR | Status: DC | PRN

## 2019-09-03 MED ORDER — EPINEPHRINE 0.3 MG/0.3ML IJ SOAJ: 0.3000 mg | Freq: Once | INTRAMUSCULAR | Status: DC | PRN

## 2019-09-03 MED ORDER — SODIUM CHLORIDE 0.9 % IV SOLN
1200.0000 mg | Freq: Once | INTRAVENOUS | Status: AC
  Administered 2019-09-03: 1200 mg via INTRAVENOUS
  Filled 2019-09-03: qty 10

## 2019-09-03 MED ORDER — ALBUTEROL SULFATE HFA 108 (90 BASE) MCG/ACT IN AERS: 2.0000 | INHALATION_SPRAY | Freq: Once | RESPIRATORY_TRACT | Status: DC | PRN

## 2019-09-03 NOTE — Progress Notes (Signed)
  Diagnosis: COVID-19  Physician:Dr. Patrick Wright  Procedure: Covid Infusion Clinic Med: casirivimab\imdevimab infusion - Provided patient with casirivimab\imdevimab fact sheet for patients, parents and caregivers prior to infusion.  Complications: No immediate complications noted.  Discharge: Discharged home   Morgan Villegas 09/03/2019  

## 2019-09-03 NOTE — Discharge Instructions (Signed)

## 2019-09-05 ENCOUNTER — Telehealth: Payer: Self-pay | Admitting: *Deleted

## 2019-09-05 NOTE — Telephone Encounter (Signed)
Pt called regarding which pharmacy Rx was e-scribed to.  RNCM reviewed chart to access After Visit Summary and found that Rx was NOT PRESCRIBED.  Advised pt that her treatment is the infusion clinic and no additional Rx was written.

## 2021-05-29 IMAGING — DX DG CHEST 1V
1 series · 1 of 1 positions shown · non-contrast
Comparison: April 25, 2019

CLINICAL DATA: Cough and shortness of breath.

EXAM:
CHEST  1 VIEW

[chest ap]
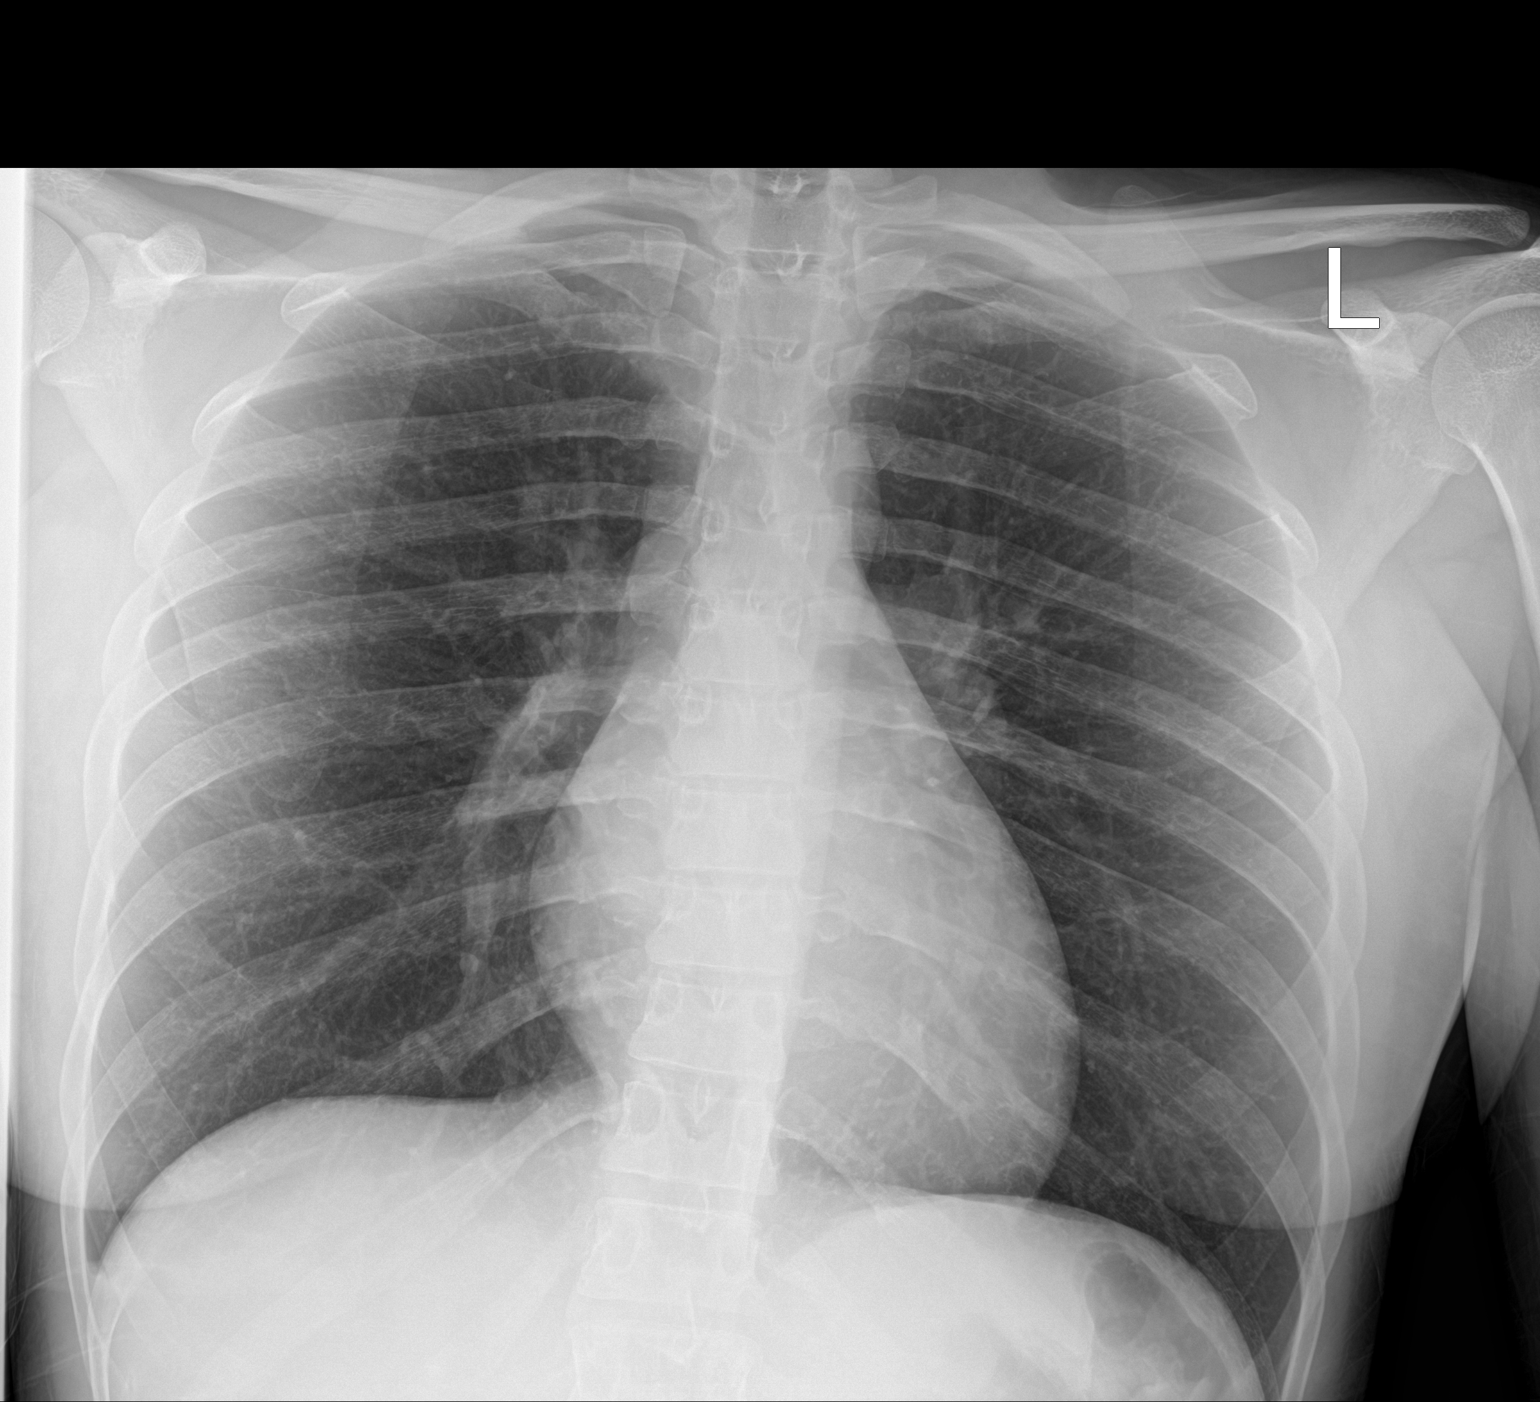

[1 of 1 positions shown; findings below may reference images not displayed]

FINDINGS: There is no evidence of acute infiltrate, pleural effusion or
pneumothorax. The heart size and mediastinal contours are within
normal limits. The visualized skeletal structures are unremarkable.
IMPRESSION: No active disease.
